# Patient Record
Sex: Female | Born: 1965 | Race: White | Hispanic: No | Marital: Married | Smoking: Never smoker
Health system: Southern US, Community
[De-identification: ages and names within clinical notes are randomized; demographics above are authoritative.]

## PROBLEM LIST (undated history)

## (undated) DIAGNOSIS — N809 Endometriosis, unspecified: Secondary | ICD-10-CM

## (undated) DIAGNOSIS — G510 Bell's palsy: Secondary | ICD-10-CM

---

## 2011-05-03 ENCOUNTER — Ambulatory Visit: Payer: Self-pay | Admitting: Family Medicine

## 2011-05-03 LAB — CBC WITH DIFFERENTIAL/PLATELET
Basophil %: 1.2 %
Eosinophil #: 0 10*3/uL (ref 0.0–0.7)
Eosinophil %: 0.2 %
HGB: 13.6 g/dL (ref 12.0–16.0)
Lymphocyte #: 0.7 10*3/uL — ABNORMAL LOW (ref 1.0–3.6)
MCH: 32.7 pg (ref 26.0–34.0)
Monocyte #: 0.3 x10 3/mm (ref 0.2–0.9)
Monocyte %: 8.3 %
Neutrophil #: 2.8 10*3/uL (ref 1.4–6.5)
Neutrophil %: 72.8 %
Platelet: 159 10*3/uL (ref 150–440)
RDW: 13.5 % (ref 11.5–14.5)
WBC: 3.9 10*3/uL (ref 3.6–11.0)

## 2011-05-03 LAB — COMPREHENSIVE METABOLIC PANEL
Albumin: 3.9 g/dL (ref 3.4–5.0)
BUN: 8 mg/dL (ref 7–18)
Bilirubin,Total: 0.5 mg/dL (ref 0.2–1.0)
Co2: 27 mmol/L (ref 21–32)
Creatinine: 0.88 mg/dL (ref 0.60–1.30)
EGFR (Non-African Amer.): >60
Glucose: 101 mg/dL — ABNORMAL HIGH (ref 65–99)
Osmolality: 269 (ref 275–301)
SGPT (ALT): 22 U/L
Sodium: 135 mmol/L — ABNORMAL LOW (ref 136–145)

## 2014-08-15 ENCOUNTER — Ambulatory Visit
Admission: EM | Admit: 2014-08-15 | Discharge: 2014-08-15 | Disposition: A | Discharge status: Home / Self Care | Payer: BLUE CROSS/BLUE SHIELD | Attending: Internal Medicine | Admitting: Internal Medicine

## 2014-08-15 ENCOUNTER — Encounter: Payer: Self-pay | Admitting: Gynecology

## 2014-08-15 DIAGNOSIS — M436 Torticollis: Secondary | ICD-10-CM

## 2014-08-15 HISTORY — DX: Endometriosis, unspecified: N80.9

## 2014-08-15 HISTORY — DX: Bell's palsy: G51.0

## 2014-08-15 MED ORDER — KETOROLAC TROMETHAMINE 60 MG/2ML IM SOLN
60.0000 mg | Freq: Once | INTRAMUSCULAR | Status: AC
  Administered 2014-08-15: 60 mg via INTRAMUSCULAR

## 2014-08-15 MED ORDER — METHOCARBAMOL 750 MG PO TABS: 750.0000 mg | ORAL_TABLET | Freq: Two times a day (BID) | ORAL | Status: DC

## 2014-08-15 MED ORDER — IBUPROFEN 600 MG PO TABS: 600.0000 mg | ORAL_TABLET | Freq: Four times a day (QID) | ORAL | Status: DC | PRN

## 2014-08-15 NOTE — ED Notes (Signed)
Bed: MUC02 Expected date:  Expected time:  Means of arrival:  Comments: 

## 2014-08-15 NOTE — Discharge Instructions (Signed)

## 2014-08-15 NOTE — ED Notes (Signed)
Pain c/o left earache and left hand pain on and off x 5 days. Pt. Question pinched nerve.

## 2014-08-15 NOTE — ED Provider Notes (Signed)
CSN: [id number]     Arrival date & time 08/15/14  0801 History   First MD Initiated Contact with Patient 08/15/14 [phone]     Chief Complaint  Patient presents with  . Otalgia  . Hand Pain   (Consider location/radiation/quality/duration/timing/severity/associated sxs/prior Treatment) HPI 49 yo F with 3-4 day hx of intermittent left neck,shoulder pain and left ear discomfort.  Early in the week it was  controlled by Aleve- no longer effective. Has a sleep number bed and has tried adjusting without improvement. Stress is way up in office right now. Works at [employer] all day  Long. 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  Has hx of Bells Palsy on 1992-93. Has not been ill.No trauma.  Past Medical History  Diagnosis Date  . Bell palsy   . Endometriosis    History reviewed. No pertinent past surgical history. History reviewed. No pertinent family history. History  Substance Use Topics  . Smoking status: Never Smoker   . Smokeless tobacco: Not on file  . Alcohol Use: Yes   OB History    No data available     Review of Systems Constitutional: No fever.  Eyes: No visual changes. ENT:No sore throat. Left ear recently began hurting- has not been ill-actually left neck muscles Cardiovascular:Negative for chest pain/palpitations Respiratory: Negative for shortness of breath Gastrointestinal: No abdominal pain. No nausea,vomiting, diarrhea Genitourinary: Negative for dysuria. Normal urination. Musculoskeletal: Negative for back pain.- left neck and shoulder have been getting worse; FROM extremities and neck with discomfort on left Skin: Negative for rash Neurological: Negative for headache, focal weakness or numbness  Allergies  Review of patient's allergies indicates no known allergies.  Home Medications   Prior to Admission medications   Medication Sig Start Date End Date Taking? Authorizing Provider  ibuprofen (ADVIL,MOTRIN) 600 MG tablet Take 1  tablet (600 mg total) by mouth every 6 (six) hours as needed for cramping. 08/15/14   Rae Halsted, PA-C  methocarbamol (ROBAXIN) 750 MG tablet Take 1 tablet (750 mg total) by mouth 2 (two) times daily. Take one tablet at bedtime, repeat in AM if needed 08/15/14   Rae Halsted, PA-C   BP 123/80 mmHg  Pulse 61  Temp(Src) 98.1 F (36.7 C) (Oral)  Resp 18  Ht  (1.727 m)  Wt 207 lb (93.895 kg)  BMI 31.48 kg/m2  SpO2 99%  LMP 07/21/2014 Physical Exam   Constitutional -alert and oriented,moderate distress, frightened about left neck pain/shoulder Head-atraumatic, normocephalic Eyes- conjunctiva normal, EOMI ,conjugate gaze Ears- Canals and TM neg Bilat ,left neck muscles tender Nose- no congestion or rhinorrhea Mouth/throat- mucous membranes moist ,oropharynx non-erythematous Neck- supple without glandular enlargement CV- regular rate, grossly normal heart sounds,  Resp-no distress, normal respiratory effort,clear to auscultation bilaterally GI- soft,non-tender,no distention GU-  not examined MSK- See HPI- muscle tension trapezius predominantly ,FROM neck and shoulderwith coaching, anxious. DTRs equal,no paresthesia,good grip bilat  All other extremities are non tender, normal ROM, all extremities, ambulatory, self-care Neuro- normal speech and language, no gross focal neurological deficit appreciated,  Skin-warm,dry ,intact; no rash noted Psych-mood and affect grossly normal; speech and behavior grossly normal ED Course  Procedures (including critical care time) Labs Review Labs Reviewed - No data to display  Imaging Review No results found. Medications  ketorolac (TORADOL) injection 60 mg (60 mg Intramuscular Given 08/15/14 0852)  Wonderful response with decreased discomfort and patient relaxation...she is pleased  MDM   1. Torticollis, acute  Diagnosis and treatment discussed-medications, positional changes, stretch exercises, computer breaks .  Questions fielded,  expectations and recommendations reviewed.  Patient expresses understanding. Will return to [facility] with questions, concern or exacerbation.   Discharge Medication List as of 08/15/2014  9:57 AM    START taking these medications   Details  methocarbamol (ROBAXIN) 750 MG tablet Take 1 tablet (750 mg total) by mouth 2 (two) times daily. Take one tablet at bedtime, repeat in AM if needed, Starting 08/15/2014, Until Discontinued, Normal    ibuprofen (ADVIL,MOTRIN) 600 MG tablet Take 1 tablet (600 mg total) by mouth every 6 (six) hours as needed for cramping., Starting 08/15/2014, Until Discontinued, Print         Rae Halsted, PA-C 08/17/14 [phone]

## 2015-01-06 DIAGNOSIS — N809 Endometriosis, unspecified: Secondary | ICD-10-CM | POA: Insufficient documentation

## 2016-10-22 DIAGNOSIS — M25462 Effusion, left knee: Secondary | ICD-10-CM | POA: Insufficient documentation

## 2016-11-16 DIAGNOSIS — S83232A Complex tear of medial meniscus, current injury, left knee, initial encounter: Secondary | ICD-10-CM | POA: Insufficient documentation

## 2017-01-08 HISTORY — PX: KNEE SURGERY: SHX244

## 2019-09-03 DIAGNOSIS — Z20828 Contact with and (suspected) exposure to other viral communicable diseases: Secondary | ICD-10-CM | POA: Diagnosis not present

## 2019-09-10 ENCOUNTER — Ambulatory Visit: Payer: BLUE CROSS/BLUE SHIELD | Admitting: Podiatry

## 2019-09-10 ENCOUNTER — Other Ambulatory Visit: Payer: Self-pay

## 2019-09-10 ENCOUNTER — Ambulatory Visit (INDEPENDENT_AMBULATORY_CARE_PROVIDER_SITE_OTHER): Payer: BC Managed Care – PPO

## 2019-09-10 DIAGNOSIS — M722 Plantar fascial fibromatosis: Secondary | ICD-10-CM | POA: Diagnosis not present

## 2019-09-10 DIAGNOSIS — N8000 Endometriosis of the uterus, unspecified: Secondary | ICD-10-CM | POA: Insufficient documentation

## 2019-09-10 DIAGNOSIS — N8003 Adenomyosis of the uterus: Secondary | ICD-10-CM | POA: Insufficient documentation

## 2019-09-10 DIAGNOSIS — M79671 Pain in right foot: Secondary | ICD-10-CM

## 2019-09-15 ENCOUNTER — Encounter: Payer: Self-pay | Admitting: Podiatry

## 2019-09-15 NOTE — Progress Notes (Signed)
  Subjective:  Patient ID: Kaitlyn Curtis, female    DOB: 02/27/1965,  MRN: [id number]  Chief Complaint  Patient presents with  . Foot Pain    Patient presents today for right heel/ankle pain and swelling x 1-2 weeks    54 y.o. female presents with the above complaint.  Patient presents with right heel pain that has been going on for quite some time.  Patient states that worse in the morning and has been swollen for past 1 to 2 weeks.  The heel is very painful sharp shooting nature.  Patient has tried elevation naproxen and icing which has not helped.  She would like to discuss treatment options she has not seen anyone else prior to seeing me.   Review of Systems: Negative except as noted in the HPI. Denies N/V/F/Ch.  Past Medical History:  Diagnosis Date  . Bell palsy   . Endometriosis     Current Outpatient Medications:  .  naproxen (NAPROSYN) 250 MG tablet, Take by mouth., Disp: , Rfl:   Social History   Tobacco Use  Smoking Status Never Smoker    No Known Allergies Objective:  There were no vitals filed for this visit. There is no height or weight on file to calculate BMI. Constitutional Well developed. Well nourished.  Vascular Dorsalis pedis pulses palpable bilaterally. Posterior tibial pulses palpable bilaterally. Capillary refill normal to all digits.  No cyanosis or clubbing noted. Pedal hair growth normal.  Neurologic Normal speech. Oriented to person, place, and time. Epicritic sensation to light touch grossly present bilaterally.  Dermatologic Nails well groomed and normal in appearance. No open wounds. No skin lesions.  Orthopedic: Normal joint ROM without pain or crepitus bilaterally. No visible deformities. Tender to palpation at the calcaneal tuber right. No pain with calcaneal squeeze right. Ankle ROM diminished range of motion right. Silfverskiold Test: positive right.   Radiographs: Taken and reviewed. No acute fractures or dislocations. No  evidence of stress fracture.  Plantar heel spur present. Posterior heel spur present.   Assessment:   1. Plantar fasciitis, right   2. Intractable right heel pain    Plan:  Patient was evaluated and treated and all questions answered.  Plantar Fasciitis, right - XR reviewed as above.  - Educated on icing and stretching. Instructions given.  - Injection delivered to the plantar fascia as below. - DME: Plantar Fascial Brace - Pharmacologic management: Meloxicam/Medrol Dose Pak. Educated on risks/benefits and proper taking of medication.  Procedure: Injection Tendon/Ligament Location: Right plantar fascia at the glabrous junction; medial approach. Skin Prep: alcohol Injectate: 0.5 cc 0.5% marcaine plain, 0.5 cc of 1% Lidocaine, 0.5 cc kenalog 10. Disposition: Patient tolerated procedure well. Injection site dressed with a band-aid.  No follow-ups on file.

## 2019-09-29 ENCOUNTER — Other Ambulatory Visit: Payer: BLUE CROSS/BLUE SHIELD

## 2019-09-29 DIAGNOSIS — J069 Acute upper respiratory infection, unspecified: Secondary | ICD-10-CM | POA: Diagnosis not present

## 2019-09-30 DIAGNOSIS — J069 Acute upper respiratory infection, unspecified: Secondary | ICD-10-CM | POA: Diagnosis not present

## 2019-09-30 DIAGNOSIS — R030 Elevated blood-pressure reading, without diagnosis of hypertension: Secondary | ICD-10-CM | POA: Diagnosis not present

## 2019-10-08 ENCOUNTER — Ambulatory Visit: Payer: BC Managed Care – PPO | Admitting: Podiatry

## 2019-11-03 ENCOUNTER — Observation Stay: Payer: BC Managed Care – PPO | Admitting: Anesthesiology

## 2019-11-03 ENCOUNTER — Encounter
Admission: EM | Disposition: A | Discharge status: Home / Self Care | Payer: Self-pay | Source: Home / Self Care | Attending: Student in an Organized Health Care Education/Training Program

## 2019-11-03 ENCOUNTER — Ambulatory Visit (INDEPENDENT_AMBULATORY_CARE_PROVIDER_SITE_OTHER)
Admit: 2019-11-03 | Discharge: 2019-11-03 | Disposition: A | Discharge status: Home / Self Care | Payer: BC Managed Care – PPO | Attending: Family Medicine | Admitting: Family Medicine

## 2019-11-03 ENCOUNTER — Other Ambulatory Visit: Payer: Self-pay

## 2019-11-03 ENCOUNTER — Encounter: Payer: Self-pay | Admitting: Emergency Medicine

## 2019-11-03 ENCOUNTER — Ambulatory Visit (INDEPENDENT_AMBULATORY_CARE_PROVIDER_SITE_OTHER)
Admission: EM | Admit: 2019-11-03 | Discharge: 2019-11-03 | Disposition: A | Discharge status: Other Acute Inpatient Hospital | Payer: BC Managed Care – PPO | Source: Home / Self Care | Attending: Family Medicine | Admitting: Family Medicine

## 2019-11-03 ENCOUNTER — Observation Stay
Admission: EM | Admit: 2019-11-03 | Discharge: 2019-11-04 | Disposition: A | Discharge status: Home / Self Care | Payer: BC Managed Care – PPO | Attending: Surgery | Admitting: Surgery

## 2019-11-03 DIAGNOSIS — K3532 Acute appendicitis with perforation and localized peritonitis, without abscess: Secondary | ICD-10-CM | POA: Diagnosis not present

## 2019-11-03 DIAGNOSIS — R1031 Right lower quadrant pain: Secondary | ICD-10-CM

## 2019-11-03 DIAGNOSIS — K3580 Unspecified acute appendicitis: Principal | ICD-10-CM | POA: Insufficient documentation

## 2019-11-03 DIAGNOSIS — Z20822 Contact with and (suspected) exposure to covid-19: Secondary | ICD-10-CM | POA: Insufficient documentation

## 2019-11-03 DIAGNOSIS — R5381 Other malaise: Secondary | ICD-10-CM | POA: Diagnosis not present

## 2019-11-03 HISTORY — PX: LAPAROSCOPIC APPENDECTOMY: SHX408

## 2019-11-03 LAB — URINALYSIS, COMPLETE (UACMP) WITH MICROSCOPIC
Bilirubin Urine: NEGATIVE
Glucose, UA: NEGATIVE mg/dL
Ketones, ur: NEGATIVE mg/dL
Leukocytes,Ua: NEGATIVE
Nitrite: NEGATIVE
Protein, ur: 30 mg/dL — AB
Specific Gravity, Urine: 1.025 (ref 1.005–1.030)
pH: 6 (ref 5.0–8.0)

## 2019-11-03 LAB — CBC WITH DIFFERENTIAL/PLATELET
Abs Immature Granulocytes: 0.04 10*3/uL (ref 0.00–0.07)
Basophils Absolute: 0.1 10*3/uL (ref 0.0–0.1)
Basophils Relative: 0 %
Eosinophils Absolute: 0 10*3/uL (ref 0.0–0.5)
Eosinophils Relative: 0 %
HCT: 39 % (ref 36.0–46.0)
Hemoglobin: 13 g/dL (ref 12.0–15.0)
Immature Granulocytes: 0 %
Lymphocytes Relative: 15 %
Lymphs Abs: 1.8 10*3/uL (ref 0.7–4.0)
MCH: 31.6 pg (ref 26.0–34.0)
MCHC: 33.3 g/dL (ref 30.0–36.0)
MCV: 94.9 fL (ref 80.0–100.0)
Monocytes Absolute: 0.8 10*3/uL (ref 0.1–1.0)
Monocytes Relative: 7 %
Neutro Abs: 9.3 10*3/uL — ABNORMAL HIGH (ref 1.7–7.7)
Neutrophils Relative %: 78 %
Platelets: 220 10*3/uL (ref 150–400)
RBC: 4.11 MIL/uL (ref 3.87–5.11)
RDW: 14 % (ref 11.5–15.5)
WBC: 11.9 10*3/uL — ABNORMAL HIGH (ref 4.0–10.5)
nRBC: 0 % (ref 0.0–0.2)

## 2019-11-03 LAB — COMPREHENSIVE METABOLIC PANEL
ALT: 31 U/L (ref 0–44)
AST: 22 U/L (ref 15–41)
Albumin: 4 g/dL (ref 3.5–5.0)
Alkaline Phosphatase: 85 U/L (ref 38–126)
Anion gap: 11 (ref 5–15)
BUN: 10 mg/dL (ref 6–20)
CO2: 28 mmol/L (ref 22–32)
Calcium: 8.9 mg/dL (ref 8.9–10.3)
Chloride: 97 mmol/L — ABNORMAL LOW (ref 98–111)
Creatinine, Ser: 0.78 mg/dL (ref 0.44–1.00)
GFR, Estimated: >60 mL/min (ref >60)
Glucose, Bld: 124 mg/dL — ABNORMAL HIGH (ref 70–99)
Potassium: 3.7 mmol/L (ref 3.5–5.1)
Sodium: 136 mmol/L (ref 135–145)
Total Bilirubin: 1.4 mg/dL — ABNORMAL HIGH (ref 0.3–1.2)
Total Protein: 7.8 g/dL (ref 6.5–8.1)

## 2019-11-03 LAB — RESPIRATORY PANEL BY RT PCR (FLU A&B, COVID)
Influenza A by PCR: NEGATIVE
Influenza B by PCR: NEGATIVE
SARS Coronavirus 2 by RT PCR: NEGATIVE

## 2019-11-03 SURGERY — APPENDECTOMY, LAPAROSCOPIC
Anesthesia: General | Site: Abdomen

## 2019-11-03 MED ORDER — BUPIVACAINE-EPINEPHRINE (PF) 0.5% -1:200000 IJ SOLN
INTRAMUSCULAR | Status: DC | PRN
  Administered 2019-11-03: 31 mL

## 2019-11-03 MED ORDER — FENTANYL CITRATE (PF) 100 MCG/2ML IJ SOLN
INTRAMUSCULAR | Status: DC | PRN
  Administered 2019-11-03 (×2): 50 ug via INTRAVENOUS

## 2019-11-03 MED ORDER — KETOROLAC TROMETHAMINE 30 MG/ML IJ SOLN
30.0000 mg | Freq: Four times a day (QID) | INTRAMUSCULAR | Status: DC
  Administered 2019-11-03 – 2019-11-04 (×3): 30 mg via INTRAVENOUS
  Filled 2019-11-03 (×3): qty 1

## 2019-11-03 MED ORDER — SODIUM CHLORIDE 0.9 % IV BOLUS
1000.0000 mL | Freq: Once | INTRAVENOUS | Status: AC
  Administered 2019-11-03: 1000 mL via INTRAVENOUS

## 2019-11-03 MED ORDER — DEXMEDETOMIDINE (PRECEDEX) IN NS 20 MCG/5ML (4 MCG/ML) IV SYRINGE
PREFILLED_SYRINGE | INTRAVENOUS | Status: AC
  Filled 2019-11-03: qty 5

## 2019-11-03 MED ORDER — KETOROLAC TROMETHAMINE 30 MG/ML IJ SOLN
INTRAMUSCULAR | Status: DC | PRN
  Administered 2019-11-03: 30 mg via INTRAVENOUS

## 2019-11-03 MED ORDER — LACTATED RINGERS IV SOLN
125.0000 mL/h | INTRAVENOUS | Status: DC
  Administered 2019-11-03 – 2019-11-04 (×2): 125 mL/h via INTRAVENOUS

## 2019-11-03 MED ORDER — ONDANSETRON HCL 4 MG/2ML IJ SOLN: 4.0000 mg | Freq: Once | INTRAMUSCULAR | Status: DC | PRN

## 2019-11-03 MED ORDER — PROPOFOL 10 MG/ML IV BOLUS
INTRAVENOUS | Status: AC
  Filled 2019-11-03: qty 20

## 2019-11-03 MED ORDER — OXYCODONE HCL 5 MG PO TABS: 5.0000 mg | ORAL_TABLET | ORAL | Status: DC | PRN

## 2019-11-03 MED ORDER — HYDROMORPHONE HCL 1 MG/ML IJ SOLN
INTRAMUSCULAR | Status: AC
  Filled 2019-11-03: qty 1

## 2019-11-03 MED ORDER — IOHEXOL 300 MG/ML  SOLN
100.0000 mL | Freq: Once | INTRAMUSCULAR | Status: AC | PRN
  Administered 2019-11-03: 100 mL via INTRAVENOUS

## 2019-11-03 MED ORDER — PROPOFOL 10 MG/ML IV BOLUS
INTRAVENOUS | Status: DC | PRN
  Administered 2019-11-03: 150 mg via INTRAVENOUS

## 2019-11-03 MED ORDER — PANTOPRAZOLE SODIUM 40 MG IV SOLR
40.0000 mg | Freq: Every day | INTRAVENOUS | Status: DC
  Administered 2019-11-03: 40 mg via INTRAVENOUS
  Filled 2019-11-03: qty 40

## 2019-11-03 MED ORDER — SUGAMMADEX SODIUM 200 MG/2ML IV SOLN
INTRAVENOUS | Status: DC | PRN
  Administered 2019-11-03: 200 mg via INTRAVENOUS

## 2019-11-03 MED ORDER — SODIUM CHLORIDE 0.9 % IV SOLN: INTRAVENOUS | Status: DC | PRN

## 2019-11-03 MED ORDER — MIDAZOLAM HCL 2 MG/2ML IJ SOLN
INTRAMUSCULAR | Status: DC | PRN
  Administered 2019-11-03: 2 mg via INTRAVENOUS

## 2019-11-03 MED ORDER — LIDOCAINE HCL (CARDIAC) PF 100 MG/5ML IV SOSY
PREFILLED_SYRINGE | INTRAVENOUS | Status: DC | PRN
  Administered 2019-11-03: 80 mg via INTRAVENOUS

## 2019-11-03 MED ORDER — HYDROMORPHONE HCL 1 MG/ML IJ SOLN: 0.5000 mg | INTRAMUSCULAR | Status: DC | PRN

## 2019-11-03 MED ORDER — ONDANSETRON HCL 4 MG/2ML IJ SOLN
INTRAMUSCULAR | Status: DC | PRN
  Administered 2019-11-03: 4 mg via INTRAVENOUS

## 2019-11-03 MED ORDER — DEXAMETHASONE SODIUM PHOSPHATE 10 MG/ML IJ SOLN
INTRAMUSCULAR | Status: DC | PRN
  Administered 2019-11-03: 10 mg via INTRAVENOUS

## 2019-11-03 MED ORDER — HYDROMORPHONE HCL 1 MG/ML IJ SOLN
INTRAMUSCULAR | Status: DC | PRN
Start: 2019-11-03 — End: 2019-11-03
  Administered 2019-11-03: .5 mg via INTRAVENOUS

## 2019-11-03 MED ORDER — MIDAZOLAM HCL 2 MG/2ML IJ SOLN
INTRAMUSCULAR | Status: AC
  Filled 2019-11-03: qty 2

## 2019-11-03 MED ORDER — ONDANSETRON HCL 4 MG/2ML IJ SOLN: 4.0000 mg | Freq: Four times a day (QID) | INTRAMUSCULAR | Status: DC | PRN

## 2019-11-03 MED ORDER — KETOROLAC TROMETHAMINE 30 MG/ML IJ SOLN
INTRAMUSCULAR | Status: AC
  Filled 2019-11-03: qty 1

## 2019-11-03 MED ORDER — FENTANYL CITRATE (PF) 100 MCG/2ML IJ SOLN: 25.0000 ug | INTRAMUSCULAR | Status: DC | PRN

## 2019-11-03 MED ORDER — DEXMEDETOMIDINE HCL 200 MCG/2ML IV SOLN
INTRAVENOUS | Status: DC | PRN
  Administered 2019-11-03: 8 ug via INTRAVENOUS
  Administered 2019-11-03: 12 ug via INTRAVENOUS

## 2019-11-03 MED ORDER — SUCCINYLCHOLINE CHLORIDE 20 MG/ML IJ SOLN
INTRAMUSCULAR | Status: DC | PRN
  Administered 2019-11-03: 100 mg via INTRAVENOUS

## 2019-11-03 MED ORDER — PIPERACILLIN-TAZOBACTAM 3.375 G IVPB
3.3750 g | Freq: Three times a day (TID) | INTRAVENOUS | Status: DC
  Administered 2019-11-03 – 2019-11-04 (×2): 3.375 g via INTRAVENOUS
  Filled 2019-11-03 (×2): qty 50

## 2019-11-03 MED ORDER — ONDANSETRON 4 MG PO TBDP: 4.0000 mg | ORAL_TABLET | Freq: Four times a day (QID) | ORAL | Status: DC | PRN

## 2019-11-03 MED ORDER — ONDANSETRON HCL 4 MG/2ML IJ SOLN: 4.0000 mg | Freq: Once | INTRAMUSCULAR | Status: DC

## 2019-11-03 MED ORDER — POLYETHYLENE GLYCOL 3350 17 G PO PACK: 17.0000 g | PACK | Freq: Every day | ORAL | Status: DC | PRN

## 2019-11-03 MED ORDER — ACETAMINOPHEN 500 MG PO TABS: 1000.0000 mg | ORAL_TABLET | Freq: Four times a day (QID) | ORAL | Status: DC | PRN

## 2019-11-03 MED ORDER — ROCURONIUM BROMIDE 100 MG/10ML IV SOLN
INTRAVENOUS | Status: DC | PRN
  Administered 2019-11-03: 10 mg via INTRAVENOUS
  Administered 2019-11-03: 40 mg via INTRAVENOUS

## 2019-11-03 MED ORDER — ENOXAPARIN SODIUM 40 MG/0.4ML ~~LOC~~ SOLN
40.0000 mg | SUBCUTANEOUS | Status: DC
  Administered 2019-11-04: 40 mg via SUBCUTANEOUS
  Filled 2019-11-03: qty 0.4

## 2019-11-03 MED ORDER — MORPHINE SULFATE (PF) 4 MG/ML IV SOLN: 4.0000 mg | INTRAVENOUS | Status: DC | PRN

## 2019-11-03 MED ORDER — PIPERACILLIN-TAZOBACTAM 3.375 G IVPB 30 MIN
3.3750 g | Freq: Once | INTRAVENOUS | Status: AC
  Administered 2019-11-03: 3.375 g via INTRAVENOUS
  Filled 2019-11-03: qty 50

## 2019-11-03 MED ORDER — SODIUM CHLORIDE 0.9 % IR SOLN
Status: DC | PRN
  Administered 2019-11-03: 700 mL

## 2019-11-03 MED ORDER — FENTANYL CITRATE (PF) 100 MCG/2ML IJ SOLN
INTRAMUSCULAR | Status: AC
  Filled 2019-11-03: qty 2

## 2019-11-03 SURGICAL SUPPLY — 45 items
CANISTER SUCT 1200ML W/VALVE (MISCELLANEOUS) IMPLANT
CHLORAPREP W/TINT 26 (MISCELLANEOUS) ×3 IMPLANT
COVER WAND RF STERILE (DRAPES) ×3 IMPLANT
CUTTER FLEX LINEAR 45M (STAPLE) IMPLANT
DEFOGGER SCOPE WARMER CLEARIFY (MISCELLANEOUS) ×3 IMPLANT
DERMABOND ADVANCED (GAUZE/BANDAGES/DRESSINGS) ×2
DERMABOND ADVANCED .7 DNX12 (GAUZE/BANDAGES/DRESSINGS) ×1 IMPLANT
ELECT CAUTERY BLADE 6.4 (BLADE) ×3 IMPLANT
ELECT REM PT RETURN 9FT ADLT (ELECTROSURGICAL) ×3
ELECTRODE REM PT RTRN 9FT ADLT (ELECTROSURGICAL) ×1 IMPLANT
GLOVE BIO SURGEON STRL SZ 6 (GLOVE) ×6 IMPLANT
GLOVE BIOGEL PI IND STRL 6.5 (GLOVE) ×2 IMPLANT
GLOVE BIOGEL PI INDICATOR 6.5 (GLOVE) ×4
GLOVE SURG SYN 7.0 (GLOVE) ×3 IMPLANT
GLOVE SURG SYN 7.5  E (GLOVE) ×2
GLOVE SURG SYN 7.5 E (GLOVE) ×1 IMPLANT
GOWN STRL REUS W/ TWL LRG LVL3 (GOWN DISPOSABLE) ×3 IMPLANT
GOWN STRL REUS W/TWL LRG LVL3 (GOWN DISPOSABLE) ×6
IRRIGATION STRYKERFLOW (MISCELLANEOUS) IMPLANT
IRRIGATOR STRYKERFLOW (MISCELLANEOUS)
IV NS 1000ML (IV SOLUTION)
IV NS 1000ML BAXH (IV SOLUTION) IMPLANT
KIT TURNOVER KIT A (KITS) ×3 IMPLANT
LABEL OR SOLS (LABEL) ×3 IMPLANT
LIGASURE LAP MARYLAND 5MM 37CM (ELECTROSURGICAL) IMPLANT
NEEDLE HYPO 22GX1.5 SAFETY (NEEDLE) ×3 IMPLANT
NS IRRIG 500ML POUR BTL (IV SOLUTION) ×3 IMPLANT
PACK LAP CHOLECYSTECTOMY (MISCELLANEOUS) ×3 IMPLANT
PENCIL ELECTRO HAND CTR (MISCELLANEOUS) ×3 IMPLANT
POUCH SPECIMEN RETRIEVAL 10MM (ENDOMECHANICALS) ×3 IMPLANT
RELOAD 45 VASCULAR/THIN (ENDOMECHANICALS) IMPLANT
RELOAD STAPLE TA45 3.5 REG BLU (ENDOMECHANICALS) ×3 IMPLANT
SCISSORS METZENBAUM CVD 33 (INSTRUMENTS) ×3 IMPLANT
SLEEVE ADV FIXATION 5X100MM (TROCAR) ×3 IMPLANT
SUT MNCRL 4-0 (SUTURE) ×2
SUT MNCRL 4-0 27XMFL (SUTURE) ×1
SUT VIC AB 3-0 SH 27 (SUTURE) ×2
SUT VIC AB 3-0 SH 27X BRD (SUTURE) ×1 IMPLANT
SUT VICRYL 0 AB UR-6 (SUTURE) ×3 IMPLANT
SUTURE MNCRL 4-0 27XMF (SUTURE) ×1 IMPLANT
SYS KII FIOS ACCESS ABD 5X100 (TROCAR) ×3
SYSTEM KII FIOS ACES ABD 5X100 (TROCAR) ×1 IMPLANT
TRAY FOLEY MTR SLVR 16FR STAT (SET/KITS/TRAYS/PACK) ×3 IMPLANT
TROCAR BALLN GELPORT 12X130M (ENDOMECHANICALS) ×3 IMPLANT
TUBING EVAC SMOKE HEATED PNEUM (TUBING) ×3 IMPLANT

## 2019-11-03 NOTE — ED Triage Notes (Signed)
BIB [facility] from [facility] due to diagnosed appendicitis. Pt reports pain 8/10. Arrives with PIV in place from UC. Pt alert and oriented X4, cooperative, RR even and unlabored, color WNL. Pt in NAD.

## 2019-11-03 NOTE — Transfer of Care (Signed)
Immediate Anesthesia Transfer of Care Note  Patient: Kaitlyn Curtis  Procedure(s) Performed: APPENDECTOMY LAPAROSCOPIC (N/A Abdomen)  Patient Location: PACU  Anesthesia Type:General  Level of Consciousness: awake, alert, and oriented  Airway & Oxygen Therapy: Patient Spontanous Breathing  Post-op Assessment: Report given to RN and Post -op Vital signs reviewed and stable  Post vital signs: Reviewed and stable  Last Vitals:  Vitals Value Taken Time  BP 126/78 11/03/19 2024  Temp 37.1 C 11/03/19 2024  Pulse 85 11/03/19 2024  Resp 15 11/03/19 2024  SpO2 95 % 11/03/19 2024    Last Pain:  Vitals:   11/03/19 2024  TempSrc:   PainSc: 0-No pain         Complications: No complications documented.

## 2019-11-03 NOTE — Op Note (Signed)
  Procedure Date:  11/03/2019  Pre-operative Diagnosis:  Acute appendicitis  Post-operative Diagnosis:  Acute appendicitis  Procedure:  Laparoscopic appendectomy  Surgeon:  Howie Ill, MD  Anesthesia:  General endotracheal  Estimated Blood Loss:  20 ml  Specimens:  appendix  Complications:  None  Indications for Procedure:  This is a 54 y.o. female who presents with abdominal pain and workup revealing acute appendicitis.  The options of surgery versus observation were reviewed with the patient and/or family. The risks of bleeding, infection, recurrence of symptoms, negative laparoscopy, potential for an open procedure, bowel injury, abscess or infection, were all discussed with the patient and she was willing to proceed.  Description of Procedure: The patient was correctly identified in the preoperative area and brought into the operating room.  The patient was placed supine with VTE prophylaxis in place.  Appropriate time-outs were performed.  Anesthesia was induced and the patient was intubated.  Foley catheter was placed.  Appropriate antibiotics were infused.  The abdomen was prepped and draped in a sterile fashion. An infraumbilical incision was made. A cutdown technique was used to enter the abdominal cavity without injury, and a Hasson trocar was inserted.  Pneumoperitoneum was obtained with appropriate opening pressures.  Two 5-mm ports were placed in the suprapubic and left lateral positions under direct visualization.  The right lower quadrant was inspected and the appendix was identified and found to be acutely inflamed with ascitic fluid in the pelvis, but no purulence and no evidence of perforation.  The appendix was carefully dissected.  The mesoappendix was divided using the LigaSure.  The base of the appendix was dissected out and divided with a standard load Endo GIA.  The appendix was placed in an Endocatch bag.  The right lower quadrant was then inspected again  revealing an intact staple line, no bleeding, and no bowel injury.  The area was thoroughly irrigated.  The 5 mm ports were removed under direct visualization and the Hasson trocar was removed.  The Endocatch bag was brought out through the umbilical incision.  The fascial opening was closed using 0 vicryl suture.  Local anesthetic was infused in all incisions.  The umbilical incision was closed with 3-0 Vicryl and 4-0 Monocryl, and the remaining incisions were closed with 4-0 Monocryl.  The wounds were cleaned and sealed with DermaBond.  Foley catheter was removed and the patient was emerged from anesthesia and extubated and brought to the recovery room for further management.  The patient tolerated the procedure well and all counts were correct at the end of the case.   Howie Ill, MD

## 2019-11-03 NOTE — Anesthesia Preprocedure Evaluation (Signed)
Anesthesia Evaluation  Patient identified by MRN, date of birth, ID band Patient awake    Reviewed: Allergy & Precautions, H&P , NPO status , Patient's Chart, lab work & pertinent test results, reviewed documented beta blocker date and time   Airway Mallampati: II  TM Distance: >3 FB Neck ROM: full    Dental  (+) Teeth Intact   Pulmonary neg pulmonary ROS,    Pulmonary exam normal        Cardiovascular Exercise Tolerance: Good negative cardio ROS Normal cardiovascular exam Rhythm:regular Rate:Normal     Neuro/Psych  Neuromuscular disease negative psych ROS   GI/Hepatic negative GI ROS, Neg liver ROS,   Endo/Other  negative endocrine ROS  Renal/GU negative Renal ROS  negative genitourinary   Musculoskeletal   Abdominal   Peds  Hematology negative hematology ROS (+)   Anesthesia Other Findings Past Medical History: No date: Bell palsy No date: Endometriosis History reviewed. No pertinent surgical history. BMI    Body Mass Index: 33.19 kg/m     Reproductive/Obstetrics negative OB ROS                             Anesthesia Physical Anesthesia Plan  ASA: II and emergent  Anesthesia Plan: General ETT   Post-op Pain Management:    Induction:   PONV Risk Score and Plan:   Airway Management Planned:   Additional Equipment:   Intra-op Plan:   Post-operative Plan:   Informed Consent: I have reviewed the patients History and Physical, chart, labs and discussed the procedure including the risks, benefits and alternatives for the proposed anesthesia with the patient or authorized representative who has indicated his/her understanding and acceptance.     Dental Advisory Given  Plan Discussed with: CRNA  Anesthesia Plan Comments:         Anesthesia Quick Evaluation

## 2019-11-03 NOTE — ED Provider Notes (Signed)
[facility] Emergency Department Provider Note    First MD Initiated Contact with Patient 11/03/19 1606     (approximate)  I have reviewed the triage vital signs and the nursing notes.   HISTORY  Chief Complaint Abdominal Pain    HPI Kaitlyn Curtis is a 55 y.o. female with no significant past medical history presents to the ER for 24 hours of what initially was generalized abdominal pain now migrating to right lower quadrant is sharp in nature.  Has had some nausea decreased p.o. intake.  Was seen in urgent care for this complaint and found to have evidence of acute appendicitis by CT.  Sent to the ER for further evaluation.    Past Medical History:  Diagnosis Date  . Bell palsy   . Endometriosis    No family history on file. History reviewed. No pertinent surgical history. Patient Active Problem List   Diagnosis Date Noted  . Uterus, adenomyosis 09/10/2019  . Complex tear of medial meniscus of left knee as current injury 11/16/2016  . Effusion, left knee 10/22/2016  . Endometriosis 01/06/2015      Prior to Admission medications   Not on File    Allergies Patient has no known allergies.    Social History Social History   Tobacco Use  . Smoking status: Never Smoker  . Smokeless tobacco: Never Used  Substance Use Topics  . Alcohol use: Yes  . Drug use: No    Review of Systems Patient denies headaches, rhinorrhea, blurry vision, numbness, shortness of breath, chest pain, edema, cough, abdominal pain, nausea, vomiting, diarrhea, dysuria, fevers, rashes or hallucinations unless otherwise stated above in HPI. ____________________________________________   PHYSICAL EXAM:  VITAL SIGNS: Vitals:   11/03/19 1603  BP: 128/68  Pulse: 81  Resp: 18  Temp: 99.8 F (37.7 C)  SpO2: 96%    Constitutional: Alert and oriented.  Eyes: Conjunctivae are normal.  Head: Atraumatic. Nose: No congestion/rhinnorhea. Mouth/Throat: Mucous  membranes are moist.   Neck: No stridor. Painless ROM.  Cardiovascular: Normal rate, regular rhythm. Grossly normal heart sounds.  Good peripheral circulation. Respiratory: Normal respiratory effort.  No retractions. Lungs CTAB. Gastrointestinal: Soft , ttp in rlq. No distention. No abdominal bruits. No CVA tenderness. Genitourinary:  Musculoskeletal: No lower extremity tenderness nor edema.  No joint effusions. Neurologic:  Normal speech and language. No gross focal neurologic deficits are appreciated. No facial droop Skin:  Skin is warm, dry and intact. No rash noted. Psychiatric: Mood and affect are normal. Speech and behavior are normal.  ____________________________________________   LABS (all labs ordered are listed, but only abnormal results are displayed)  Results for orders placed or performed during the hospital encounter of 11/03/19 (from the past 24 hour(s))  Urinalysis, Complete w Microscopic Urine, Clean Catch     Status: Abnormal   Collection Time: 11/03/19  1:13 PM  Result Value Ref Range   Color, Urine YELLOW YELLOW   APPearance CLEAR CLEAR   Specific Gravity, Urine 1.025 1.005 - 1.030   pH 6.0 5.0 - 8.0   Glucose, UA NEGATIVE NEGATIVE mg/dL   Hgb urine dipstick TRACE (A) NEGATIVE   Bilirubin Urine NEGATIVE NEGATIVE   Ketones, ur NEGATIVE NEGATIVE mg/dL   Protein, ur 30 (A) NEGATIVE mg/dL   Nitrite NEGATIVE NEGATIVE   Leukocytes,Ua NEGATIVE NEGATIVE   Squamous Epithelial / LPF 0-5 0 - 5   WBC, UA 0-5 0 - 5 WBC/hpf   RBC / HPF 6-10 0 - 5 RBC/hpf  Bacteria, UA FEW (A) NONE SEEN   Mucus PRESENT   CBC with Differential     Status: Abnormal   Collection Time: 11/03/19  1:55 PM  Result Value Ref Range   WBC 11.9 (H) 4.0 - 10.5 K/uL   RBC 4.11 3.87 - 5.11 MIL/uL   Hemoglobin 13.0 12.0 - 15.0 g/dL   HCT 16.0 36 - 46 %   MCV 94.9 80.0 - 100.0 fL   MCH 31.6 26.0 - 34.0 pg   MCHC 33.3 30.0 - 36.0 g/dL   RDW 10.9 32.3 - 55.7 %   Platelets 220 150 - 400 K/uL    nRBC 0.0 0.0 - 0.2 %   Neutrophils Relative % 78 %   Neutro Abs 9.3 (H) 1.7 - 7.7 K/uL   Lymphocytes Relative 15 %   Lymphs Abs 1.8 0.7 - 4.0 K/uL   Monocytes Relative 7 %   Monocytes Absolute 0.8 0.1 - 1.0 K/uL   Eosinophils Relative 0 %   Eosinophils Absolute 0.0 0.0 - 0.5 K/uL   Basophils Relative 0 %   Basophils Absolute 0.1 0.0 - 0.1 K/uL   Immature Granulocytes 0 %   Abs Immature Granulocytes 0.04 0.00 - 0.07 K/uL  Comprehensive metabolic panel     Status: Abnormal   Collection Time: 11/03/19  1:55 PM  Result Value Ref Range   Sodium 136 135 - 145 mmol/L   Potassium 3.7 3.5 - 5.1 mmol/L   Chloride 97 (L) 98 - 111 mmol/L   CO2 28 22 - 32 mmol/L   Glucose, Bld 124 (H) 70 - 99 mg/dL   BUN 10 6 - 20 mg/dL   Creatinine, Ser 3.22 0.44 - 1.00 mg/dL   Calcium 8.9 8.9 - 02.5 mg/dL   Total Protein 7.8 6.5 - 8.1 g/dL   Albumin 4.0 3.5 - 5.0 g/dL   AST 22 15 - 41 U/L   ALT 31 0 - 44 U/L   Alkaline Phosphatase 85 38 - 126 U/L   Total Bilirubin 1.4 (H) 0.3 - 1.2 mg/dL   GFR, Estimated >42 >70 mL/min   Anion gap 11 5 - 15   ____________________________________________ ____________________________________________  RADIOLOGY  CT abd reviewed ____________________________________________   PROCEDURES  Procedure(s) performed:  Procedures    Critical Care performed: no ____________________________________________   INITIAL IMPRESSION / ASSESSMENT AND PLAN / ED COURSE  Pertinent labs & imaging results that were available during my care of the patient were reviewed by me and considered in my medical decision making (see chart for details).   DDX: Appendicitis, UTI, diverticulitis  Kaitlyn Curtis is a 54 y.o. who presents to the ED with evidence of acute appendicitis by CT from urgent care.  She is hemodynamically stable.  Will give IV pain medication as well as IV fluids.  Will consult surgery for admission.     The patient was evaluated in Emergency Department today  for the symptoms described in the history of present illness. He/she was evaluated in the context of the global COVID-19 pandemic, which necessitated consideration that the patient might be at risk for infection with the SARS-CoV-2 virus that causes COVID-19. Institutional protocols and algorithms that pertain to the evaluation of patients at risk for COVID-19 are in a state of rapid change based on information released by regulatory bodies including the CDC and federal and state organizations. These policies and algorithms were followed during the patient's care in the ED.  As part of my medical decision making, I reviewed the  following data within the electronic MEDICAL RECORD NUMBER Nursing notes reviewed and incorporated, Labs reviewed, notes from prior ED visits and [place] Controlled Substance Database   ____________________________________________   FINAL CLINICAL IMPRESSION(S) / ED DIAGNOSES  Final diagnoses:  Right lower quadrant abdominal pain  Acute appendicitis, unspecified acute appendicitis type      NEW MEDICATIONS STARTED DURING THIS VISIT:  New Prescriptions   No medications on file     Note:  This document was prepared using Dragon voice recognition software and may include unintentional dictation errors.    Willy Eddy, MD 11/03/19 [phone]

## 2019-11-03 NOTE — H&P (Signed)
Date of Admission:  11/03/2019  Reason for Admission:  Acute appendicitis  History of Present Illness: Kaitlyn Curtis is a 54 y.o. female presenting with a 2-day history of abdominal pain.  Patient reports that yesterday she started having mid abdominal pain that today started progressing and localizing towards the right lower quadrant.  Patient reports that the pain was quite severe earlier.  She denies having any nausea or vomiting but reports feeling feverish at home.  She presented to urgent care today and she had a CT scan done there which showed acute appendicitis and was sent to the emergency room for further evaluation.  Her laboratory work-up shows a white blood cell count of 11.9 with a normal creatinine of 0.78.  Her CT scan which is personally viewed by me shows acute appendicitis with no evidence of perforation or abscess.  The appendix is retrocecal.  Past Medical History: Past Medical History:  Diagnosis Date  . Bell palsy   . Endometriosis      Past Surgical History: --Diagnostic laparoscopy for endometriosis  Home Medications: Prior to Admission medications   None    Allergies: No Known Allergies  Social History:  reports that she has never smoked. She has never used smokeless tobacco. She reports current alcohol use. She reports that she does not use drugs.   Family History: No family history on file.  Review of Systems: Review of Systems  Constitutional: Positive for fever. Negative for chills.  HENT: Negative for hearing loss.   Respiratory: Negative for shortness of breath.   Cardiovascular: Negative for chest pain.  Gastrointestinal: Positive for abdominal pain. Negative for constipation, diarrhea, nausea and vomiting.  Genitourinary: Negative for dysuria.  Musculoskeletal: Negative for myalgias.  Skin: Negative for rash.  Neurological: Negative for dizziness.  Psychiatric/Behavioral: Negative for depression.    Physical Exam BP 128/68 (BP  Location: Left Arm)   Pulse 81   Temp 99.8 F (37.7 C) (Oral)   Resp 18   Ht 5\' 8"  (1.727 m)   Wt 99 kg   LMP 07/21/2014   SpO2 96%   BMI 33.19 kg/m  CONSTITUTIONAL: No acute distress HEENT:  Normocephalic, atraumatic, extraocular motion intact. NECK: Trachea is midline, and there is no jugular venous distension.  RESPIRATORY:  Lungs are clear, and breath sounds are equal bilaterally. Normal respiratory effort without pathologic use of accessory muscles. CARDIOVASCULAR: Heart is regular without murmurs, gallops, or rubs. GI: The abdomen is soft, obese, non-distended, with tenderness to palpation from mid abdomen going to RLQ, with worse pain in the RLQ.  No generalized peritonitis.  MUSCULOSKELETAL:  Normal muscle strength and tone in all four extremities.  No peripheral edema or cyanosis. SKIN: Skin turgor is normal. There are no pathologic skin lesions.  NEUROLOGIC:  Motor and sensation is grossly normal.  Cranial nerves are grossly intact. PSYCH:  Alert and oriented to person, place and time. Affect is normal.  Laboratory Analysis: Results for orders placed or performed during the hospital encounter of 11/03/19 (from the past 24 hour(s))  Respiratory Panel by RT PCR (Flu A&B, Covid) - Nasopharyngeal Swab     Status: None   Collection Time: 11/03/19  4:37 PM   Specimen: Nasopharyngeal Swab  Result Value Ref Range   SARS Coronavirus 2 by RT PCR NEGATIVE NEGATIVE   Influenza A by PCR NEGATIVE NEGATIVE   Influenza B by PCR NEGATIVE NEGATIVE    Imaging: CT ABDOMEN PELVIS W CONTRAST  Result Date: 11/03/2019 CLINICAL DATA:  Right lower  quadrant pain EXAM: CT ABDOMEN AND PELVIS WITH CONTRAST TECHNIQUE: Multidetector CT imaging of the abdomen and pelvis was performed using the standard protocol following bolus administration of intravenous contrast. CONTRAST:  OMNIPAQUE IOHEXOL 300 MG/ML  SOLN COMPARISON:  None. FINDINGS: Lower chest: No acute abnormality. Hepatobiliary: Liver is  unremarkable. Gallbladder is unremarkable. No biliary dilatation. Pancreas: Unremarkable. Spleen: Unremarkable. Adrenals/Urinary Tract: Adrenals, kidneys, and bladder are unremarkable. Stomach/Bowel: Stomach is within normal limits. The appendix is fluid-filled and mildly dilated. There are surrounding inflammatory changes. Distal colonic diverticulosis. Vascular/Lymphatic: No significant vascular findings are present. Small right lower quadrant lymph nodes are likely reactive. Reproductive: Uterus and bilateral adnexa are unremarkable. Other: Abdominal wall is unremarkable. Musculoskeletal: Lower lumbar spine degenerative changes. No acute osseous abnormality. IMPRESSION: Acute appendicitis.  No evidence of perforation or abscess. These results will be called to the ordering clinician or representative by the Radiologist Assistant, and communication documented in the PACS or [employer]. Electronically Signed   By: Guadlupe Spanish M.D.   On: 11/03/2019 15:00    Assessment and Plan: This is a 54 y.o. female with acute appendicitis.  --Patient will be admitted to surgical team.  She'll be NPO, with IV fluids, and IV antibiotics.  Discussed with her about laparoscopic appendectomy, particularly risks of bleeding, infection, injury to surrounding structures, and she's willing to proceed.  Discussed post-op course, possible discharge tomorrow.  COVID test is currently pending and will proceed once it's done and pending anesthesia availability.   Howie Ill, MD [facility] Pg:  [phone]

## 2019-11-03 NOTE — Anesthesia Procedure Notes (Signed)
Procedure Name: Intubation Date/Time: 11/03/2019 6:46 PM Performed by: Hezzie Bump, CRNA Pre-anesthesia Checklist: Patient identified, Patient being monitored, Timeout performed, Emergency Drugs available and Suction available Patient Re-evaluated:Patient Re-evaluated prior to induction Oxygen Delivery Method: Circle system utilized Preoxygenation: Pre-oxygenation with 100% oxygen Induction Type: Rapid sequence and IV induction Ventilation: Mask ventilation without difficulty Laryngoscope Size: 3 and McGraph Grade View: Grade I Tube type: Oral Tube size: 7.0 mm Number of attempts: 1 Airway Equipment and Method: Stylet Placement Confirmation: ETT inserted through vocal cords under direct vision,  positive ETCO2 and breath sounds checked- equal and bilateral Secured at: 21 cm Tube secured with: Tape Dental Injury: Teeth and Oropharynx as per pre-operative assessment

## 2019-11-03 NOTE — ED Triage Notes (Signed)
Called patient's ins co ([employer]) @ [phone] to pre-cert CT abd/pelvis with contrast (CPT [phone]). Spoke with Cala Bradford, auth# [id number] valid 11/03/19 to 04/30/20.

## 2019-11-03 NOTE — ED Notes (Signed)
Pt presents to ED with c/o of RLQ pain that started yesterday that was ore profuse through ABD but states this morning it became more localized to RLQ area. Pt states pain is tolerable at this time and rates it 6/10 and states the ride per EMS was bumpy which at first increased pain. Pt denies fevers or chills. Pt denies N/V/D.

## 2019-11-03 NOTE — ED Notes (Signed)
Pt resting comfortably at this time. No distress noted.

## 2019-11-03 NOTE — ED Triage Notes (Signed)
Patient is being discharged from the Urgent Care and sent to the Emergency Department via EMS . Per Dr. Adriana Simas, patient is in need of higher level of care due to acute appendicitis. Patient is aware and verbalizes understanding of plan of care.  Vitals:   11/03/19 1302  BP: 134/88  Pulse: 72  Resp: 18  Temp: 99.5 F (37.5 C)  SpO2: 98%

## 2019-11-03 NOTE — ED Provider Notes (Signed)
[facility]    CSN: [id number] Arrival date & time: 11/03/19  1243      History   Chief Complaint Chief Complaint  Patient presents with  . Abdominal Pain  . Chest Pain   HPI  54 year old female presents with the above complaints.  Patient reports that she developed abdominal pain yesterday.  Is currently located in the right lower quadrant.  Sharp.  Severe.  Currently 8/10 in severity.  Worse with movement and was worse on the drive over.  She has had a subjective fever and chills.  No documented true fever.  Denies nausea vomiting.  She does note anorexia.  Patient states that she is also had some diffuse chest tightness.  She believes that this may be from anxiety.  She is unsure.  No relieving factors.  No other associated symptoms.  No other complaints.  Past Medical History:  Diagnosis Date  . Bell palsy   . Endometriosis    Patient Active Problem List   Diagnosis Date Noted  . Uterus, adenomyosis 09/10/2019  . Complex tear of medial meniscus of left knee as current injury 11/16/2016  . Effusion, left knee 10/22/2016  . Endometriosis 01/06/2015   Home Medications    Prior to Admission medications   Not on File    Family History History reviewed. No pertinent family history.  Social History Social History   Tobacco Use  . Smoking status: Never Smoker  . Smokeless tobacco: Never Used  Substance Use Topics  . Alcohol use: Yes  . Drug use: No     Allergies   Patient has no known allergies.   Review of Systems Review of Systems  Constitutional: Positive for appetite change.  Respiratory: Positive for chest tightness.   Gastrointestinal: Positive for abdominal pain. Negative for nausea and vomiting.   Physical Exam Triage Vital Signs ED Triage Vitals  Enc Vitals Group     BP 11/03/19 1302 134/88     Pulse Rate 11/03/19 1302 72     Resp 11/03/19 1302 18     Temp 11/03/19 1302 99.5 F (37.5 C)     Temp Source 11/03/19 1302 Oral      SpO2 11/03/19 1302 98 %     Weight 11/03/19 1300 220 lb (99.8 kg)     Height 11/03/19 1300 5\' 8"  (1.727 m)     Head Circumference --      Peak Flow --      Pain Score 11/03/19 1300 8     Pain Loc --      Pain Edu? --      Excl. in GC? --    Updated Vital Signs BP 134/88 (BP Location: Right Arm)   Pulse 72   Temp 99.5 F (37.5 C) (Oral)   Resp 18   Ht 5\' 8"  (1.727 m)   Wt 99.8 kg   LMP 07/21/2014   SpO2 98%   BMI 33.45 kg/m   Visual Acuity Right Eye Distance:   Left Eye Distance:   Bilateral Distance:    Right Eye Near:   Left Eye Near:    Bilateral Near:     Physical Exam Vitals and nursing note reviewed.  Constitutional:      General: She is not in acute distress.    Appearance: Normal appearance.  HENT:     Head: Normocephalic and atraumatic.  Eyes:     General:        Right eye: No discharge.  Left eye: No discharge.     Conjunctiva/sclera: Conjunctivae normal.  Cardiovascular:     Rate and Rhythm: Normal rate and regular rhythm.  Pulmonary:     Effort: Pulmonary effort is normal.     Breath sounds: Normal breath sounds. No wheezing, rhonchi or rales.  Abdominal:     Comments: Soft, nondistended.  Exquisite tenderness to palpation in the right lower quadrant.  Neurological:     Mental Status: She is alert.  Psychiatric:        Mood and Affect: Mood normal.        Behavior: Behavior normal.    UC Treatments / Results  Labs (all labs ordered are listed, but only abnormal results are displayed) Labs Reviewed  URINALYSIS, COMPLETE (UACMP) WITH MICROSCOPIC - Abnormal; Notable for the following components:      Result Value   Hgb urine dipstick TRACE (*)    Protein, ur 30 (*)    Bacteria, UA FEW (*)    All other components within normal limits  CBC WITH DIFFERENTIAL/PLATELET - Abnormal; Notable for the following components:   WBC 11.9 (*)    Neutro Abs 9.3 (*)    All other components within normal limits  COMPREHENSIVE METABOLIC PANEL -  Abnormal; Notable for the following components:   Chloride 97 (*)    Glucose, Bld 124 (*)    Total Bilirubin 1.4 (*)    All other components within normal limits    EKG Interpretation: Normal sinus rhythm at the rate of 72.  Left axis deviation.  Incomplete right bundle branch block noted.  No ST or T wave changes.  Radiology CT ABDOMEN PELVIS W CONTRAST  Result Date: 11/03/2019 CLINICAL DATA:  Right lower quadrant pain EXAM: CT ABDOMEN AND PELVIS WITH CONTRAST TECHNIQUE: Multidetector CT imaging of the abdomen and pelvis was performed using the standard protocol following bolus administration of intravenous contrast. CONTRAST:  OMNIPAQUE IOHEXOL 300 MG/ML  SOLN COMPARISON:  None. FINDINGS: Lower chest: No acute abnormality. Hepatobiliary: Liver is unremarkable. Gallbladder is unremarkable. No biliary dilatation. Pancreas: Unremarkable. Spleen: Unremarkable. Adrenals/Urinary Tract: Adrenals, kidneys, and bladder are unremarkable. Stomach/Bowel: Stomach is within normal limits. The appendix is fluid-filled and mildly dilated. There are surrounding inflammatory changes. Distal colonic diverticulosis. Vascular/Lymphatic: No significant vascular findings are present. Small right lower quadrant lymph nodes are likely reactive. Reproductive: Uterus and bilateral adnexa are unremarkable. Other: Abdominal wall is unremarkable. Musculoskeletal: Lower lumbar spine degenerative changes. No acute osseous abnormality. IMPRESSION: Acute appendicitis.  No evidence of perforation or abscess. These results will be called to the ordering clinician or representative by the Radiologist Assistant, and communication documented in the PACS or [employer]. Electronically Signed   By: Guadlupe Spanish M.D.   On: 11/03/2019 15:00    Procedures Procedures (including critical care time)  Medications Ordered in UC Medications - No data to display  Initial Impression / Assessment and Plan / UC Course  I have  reviewed the triage vital signs and the nursing notes.  Pertinent labs & imaging results that were available during my care of the patient were reviewed by me and considered in my medical decision making (see chart for details).    54 year old female presents with abdominal pain.  Exquisite tenderness of the right lower quadrant on exam.  Labs notable for leukocytosis of 11.9.  Urinalysis not consistent with UTI.  CT abdomen pelvis was obtained after prior authorization.  It revealed findings consistent with acute appendicitis.  Patient is being transported to  the hospital via EMS.  I have spoken with [facility].  She needs surgical evaluation and admission.  Final Clinical Impressions(s) / UC Diagnoses   Final diagnoses:  Acute appendicitis, unspecified acute appendicitis type   Discharge Instructions   None    ED Prescriptions    None     PDMP not reviewed this encounter.   Tommie Sams, [place] 11/03/19 1514

## 2019-11-03 NOTE — ED Triage Notes (Signed)
Patient c/o RLQ sharp abdominal pain that started yesterday. She also reports chest tightness that started yesterday. She states she has been having diarrhea but has not had a BM since Sunday.

## 2019-11-03 NOTE — ED Notes (Signed)
Report to OR nurse. Last NPO status 0630. Pt given gown to undress in PACU due to pt being ina hallway bed. Pt sent with patient stickers.

## 2019-11-04 ENCOUNTER — Encounter: Payer: Self-pay | Admitting: Surgery

## 2019-11-04 LAB — BASIC METABOLIC PANEL
Anion gap: 11 (ref 5–15)
BUN: 11 mg/dL (ref 6–20)
CO2: 25 mmol/L (ref 22–32)
Calcium: 8.4 mg/dL — ABNORMAL LOW (ref 8.9–10.3)
Chloride: 102 mmol/L (ref 98–111)
Creatinine, Ser: 0.66 mg/dL (ref 0.44–1.00)
GFR, Estimated: >60 mL/min (ref >60)
Glucose, Bld: 152 mg/dL — ABNORMAL HIGH (ref 70–99)
Potassium: 3.8 mmol/L (ref 3.5–5.1)
Sodium: 138 mmol/L (ref 135–145)

## 2019-11-04 LAB — CBC
HCT: 34.4 % — ABNORMAL LOW (ref 36.0–46.0)
Hemoglobin: 11.5 g/dL — ABNORMAL LOW (ref 12.0–15.0)
MCH: 32 pg (ref 26.0–34.0)
MCHC: 33.4 g/dL (ref 30.0–36.0)
MCV: 95.8 fL (ref 80.0–100.0)
Platelets: 188 10*3/uL (ref 150–400)
RBC: 3.59 MIL/uL — ABNORMAL LOW (ref 3.87–5.11)
RDW: 14 % (ref 11.5–15.5)
WBC: 13.2 10*3/uL — ABNORMAL HIGH (ref 4.0–10.5)
nRBC: 0 % (ref 0.0–0.2)

## 2019-11-04 LAB — MAGNESIUM: Magnesium: 2.2 mg/dL (ref 1.7–2.4)

## 2019-11-04 LAB — HIV ANTIBODY (ROUTINE TESTING W REFLEX): HIV Screen 4th Generation wRfx: NONREACTIVE

## 2019-11-04 MED ORDER — IBUPROFEN 800 MG PO TABS: 800.0000 mg | ORAL_TABLET | Freq: Three times a day (TID) | ORAL | 0 refills | Status: DC | PRN

## 2019-11-04 MED ORDER — AMOXICILLIN-POT CLAVULANATE 875-125 MG PO TABS: 1.0000 | ORAL_TABLET | Freq: Two times a day (BID) | ORAL | 0 refills | Status: AC

## 2019-11-04 MED ORDER — OXYCODONE HCL 5 MG PO TABS: 5.0000 mg | ORAL_TABLET | Freq: Four times a day (QID) | ORAL | 0 refills | Status: DC | PRN

## 2019-11-04 NOTE — Discharge Instructions (Signed)
In addition to included general post-operative instructions for laparoscopic appendectomy (Appendix removal),  Diet: Resume home diet.   Activity: No heavy lifting >20 pounds (children, pets, laundry, garbage) for 4 weeks, but light activity and walking are encouraged. Do not drive or drink alcohol if taking narcotic pain medications or having pain that might distract from driving.  Wound care: 2 days after surgery (10/28), you may shower/get incision wet with soapy water and pat dry (do not rub incisions), but no baths or submerging incision underwater until follow-up.   Medications: Resume all home medications. For mild to moderate pain: acetaminophen (Tylenol) or ibuprofen/naproxen (if no kidney disease). Combining Tylenol with alcohol can substantially increase your risk of causing liver disease. Narcotic pain medications, if prescribed, can be used for severe pain, though may cause nausea, constipation, and drowsiness. Do not combine Tylenol and Percocet (or similar) within a 6 hour period as Percocet (and similar) contain(s) Tylenol. If you do not need the narcotic pain medication, you do not need to fill the prescription.  Call office [phone] / [phone]) at any time if any questions, worsening pain, fevers/chills, bleeding, drainage from incision site, or other concerns.

## 2019-11-04 NOTE — Progress Notes (Addendum)
Patient to tolerate lunch prior to discharge.   1317: Patient tolerated lunch without difficulty.

## 2019-11-04 NOTE — Discharge Summary (Addendum)
[facility] SURGICAL DISCHARGE SUMMARY  Patient ID: Kaitlyn Curtis MRN: [id number] DOB/AGE: September 28, 1965 54 y.o.  Admit date: 11/03/2019 Discharge date: 11/04/2019  Discharge Diagnoses Patient Active Problem List   Diagnosis Date Noted  . Acute appendicitis 11/03/2019  . Uterus, adenomyosis 09/10/2019  . Complex tear of medial meniscus of left knee as current injury 11/16/2016  . Effusion, left knee 10/22/2016  . Endometriosis 01/06/2015    Consultants None  Procedures 11/03/2019:  Laparoscopic Appendectomy    HPI: Kaitlyn Curtis is a 54 y.o. female presenting with a 2-day history of abdominal pain.  Patient reports that yesterday she started having mid abdominal pain that today started progressing and localizing towards the right lower quadrant.  Patient reports that the pain was quite severe earlier.  She denies having any nausea or vomiting but reports feeling feverish at home.  She presented to urgent care today and she had a CT scan done there which showed acute appendicitis and was sent to the emergency room for further evaluation.  Her laboratory work-up shows a white blood cell count of 11.9 with a normal creatinine of 0.78.  Her CT scan which is personally viewed by me shows acute appendicitis with no evidence of perforation or abscess.  The appendix is retrocecal.   Hospital Course: Informed consent was obtained and documented, and patient underwent uneventful laparoscopic appendectomy (Dr Leland Johns, 11/03/2019).  Post-operatively, patient's pain/symptoms improved/resolved and advancement of patient's diet and ambulation were well-tolerated. The remainder of patient's hospital course was essentially unremarkable, and discharge planning was initiated accordingly with patient safely able to be discharged home with appropriate discharge instructions, antibiotics (Augmentin x10 days), pain control, and outpatient follow-up after all of her questions were  answered to her expressed satisfaction.   Discharge Condition: Good   Physical Examination:  Constitutional: Well appearing female, NAD Pulmonary: Normal effort, no respiratory distress Gastrointestinal: Soft, incisional soreness, non-distended, no rebound/guarding Skin: Laparoscopic incisions are CDI, some ecchymosis, no erythema or drainage    Allergies as of 11/04/2019   No Known Allergies     Medication List    TAKE these medications   amoxicillin-clavulanate 875-125 MG tablet Commonly known as: Augmentin Take 1 tablet by mouth 2 (two) times daily for 10 days.   ibuprofen 800 MG tablet Commonly known as: ADVIL Take 1 tablet (800 mg total) by mouth every 8 (eight) hours as needed.   oxyCODONE 5 MG immediate release tablet Commonly known as: Oxy IR/ROXICODONE Take 1 tablet (5 mg total) by mouth every 6 (six) hours as needed for severe pain or breakthrough pain.         Follow-up Information    Donovan Kail, PA-C. Schedule an appointment as soon as possible for a visit in 2 week(s).   Specialty: Physician Assistant Why: s/p lap appy Contact information: [address] [place] [address] [phone]                Time spent on discharge management including discussion of hospital course, clinical condition, outpatient instructions, prescriptions, and follow up with the patient and members of the medical team: >30 minutes  -- Lynden Oxford , PA-C [facility]  11/04/2019, 10:03 AM [phone] M-F: 7am - 4pm

## 2019-11-05 LAB — SURGICAL PATHOLOGY

## 2019-11-16 NOTE — Anesthesia Postprocedure Evaluation (Signed)
Anesthesia Post Note  Patient: Kaitlyn Curtis  Procedure(s) Performed: APPENDECTOMY LAPAROSCOPIC (N/A Abdomen)  Patient location during evaluation: PACU Anesthesia Type: General Level of consciousness: awake and alert Pain management: pain level controlled Vital Signs Assessment: post-procedure vital signs reviewed and stable Respiratory status: spontaneous breathing, nonlabored ventilation, respiratory function stable and patient connected to nasal cannula oxygen Cardiovascular status: blood pressure returned to baseline and stable Postop Assessment: no apparent nausea or vomiting Anesthetic complications: no   No complications documented.   Last Vitals:  Vitals:   11/04/19 0507 11/04/19 0803  BP: 118/75 92/60  Pulse: 65 73  Resp: 18 18  Temp: 36.9 C 36.6 C  SpO2: 98% 96%    Last Pain:  Vitals:   11/04/19 0803  TempSrc: Oral  PainSc: 3                  Yevette Edwards

## 2019-11-20 ENCOUNTER — Encounter: Payer: Self-pay | Admitting: Physician Assistant

## 2019-11-20 ENCOUNTER — Other Ambulatory Visit: Payer: Self-pay

## 2019-11-20 ENCOUNTER — Ambulatory Visit (INDEPENDENT_AMBULATORY_CARE_PROVIDER_SITE_OTHER): Payer: BC Managed Care – PPO | Admitting: Physician Assistant

## 2019-11-20 VITALS — BP 122/81 | HR 57 | Temp 98.1°F | Resp 14 | Ht 68.5 in | Wt 224.0 lb

## 2019-11-20 DIAGNOSIS — K353 Acute appendicitis with localized peritonitis, without perforation or gangrene: Secondary | ICD-10-CM

## 2019-11-20 DIAGNOSIS — Z09 Encounter for follow-up examination after completed treatment for conditions other than malignant neoplasm: Secondary | ICD-10-CM

## 2019-11-20 NOTE — Progress Notes (Signed)
[facility] POST-OP OFFICE VISIT  11/20/2019  HPI: Kaitlyn Curtis is a 54 y.o. female 17 days s/p laparoscopic appendectomy for acute appendicitis with Dr Aleen Campi   She is doing well Minimal pain, soreness at most, not requiring pain medications No fever, chills, nausea, emesis Tolerating PO Normal bowel function No issues with incisions  Vital signs: BP 122/81   Pulse (!) 57   Temp 98.1 F (36.7 C) (Oral)   Resp 14   Ht 5' 8.5" (1.74 m)   Wt 224 lb (101.6 kg)   LMP 07/21/2014   SpO2 97%   BMI 33.56 kg/m    Physical Exam: Constitutional: Well appearing female, NAD Abdomen: Soft, non-tender, non-distended, no rebound/guiarding Skin: Laparoscopic incisions are well healed, no erythema or drainage   Assessment/Plan: This is a 54 y.o. female 17 days s/p laparoscopic appendectomy for acute appendicitis   - Pain control prn  - Reviewed wound care   -Reviewed lifting restrictions; complete 2 more weeks  - Reviewed pathology: Acute appendicitis, adhesions, negative for malignancy  - Rtc prn   -- Lynden Oxford, PA-C [facility] 11/20/2019, 10:39 AM [phone] M-F: 7am - 4pm

## 2019-11-20 NOTE — Patient Instructions (Signed)

## 2020-01-14 DIAGNOSIS — Z20822 Contact with and (suspected) exposure to covid-19: Secondary | ICD-10-CM | POA: Diagnosis not present

## 2020-01-14 DIAGNOSIS — R197 Diarrhea, unspecified: Secondary | ICD-10-CM | POA: Diagnosis not present

## 2020-01-15 DIAGNOSIS — R197 Diarrhea, unspecified: Secondary | ICD-10-CM | POA: Diagnosis not present

## 2020-02-23 DIAGNOSIS — S060X0A Concussion without loss of consciousness, initial encounter: Secondary | ICD-10-CM | POA: Diagnosis not present

## 2020-02-23 DIAGNOSIS — S0990XA Unspecified injury of head, initial encounter: Secondary | ICD-10-CM | POA: Diagnosis not present

## 2020-02-24 DIAGNOSIS — R519 Headache, unspecified: Secondary | ICD-10-CM | POA: Diagnosis not present

## 2020-02-24 DIAGNOSIS — S060X0A Concussion without loss of consciousness, initial encounter: Secondary | ICD-10-CM | POA: Diagnosis not present

## 2020-02-24 DIAGNOSIS — R5383 Other fatigue: Secondary | ICD-10-CM | POA: Diagnosis not present

## 2020-02-24 DIAGNOSIS — Z87891 Personal history of nicotine dependence: Secondary | ICD-10-CM | POA: Diagnosis not present

## 2020-02-25 DIAGNOSIS — F0781 Postconcussional syndrome: Secondary | ICD-10-CM | POA: Diagnosis not present

## 2020-03-10 DIAGNOSIS — F0781 Postconcussional syndrome: Secondary | ICD-10-CM | POA: Diagnosis not present

## 2020-03-10 DIAGNOSIS — N951 Menopausal and female climacteric states: Secondary | ICD-10-CM | POA: Diagnosis not present

## 2020-03-24 ENCOUNTER — Other Ambulatory Visit: Payer: Self-pay | Admitting: Neurology

## 2020-03-24 DIAGNOSIS — E538 Deficiency of other specified B group vitamins: Secondary | ICD-10-CM | POA: Diagnosis not present

## 2020-03-24 DIAGNOSIS — F0781 Postconcussional syndrome: Secondary | ICD-10-CM | POA: Diagnosis not present

## 2020-03-24 DIAGNOSIS — Z87820 Personal history of traumatic brain injury: Secondary | ICD-10-CM | POA: Diagnosis not present

## 2020-03-24 DIAGNOSIS — E559 Vitamin D deficiency, unspecified: Secondary | ICD-10-CM | POA: Diagnosis not present

## 2020-04-07 ENCOUNTER — Ambulatory Visit
Admission: RE | Admit: 2020-04-07 | Discharge: 2020-04-07 | Disposition: A | Discharge status: Home / Self Care | Payer: BC Managed Care – PPO | Source: Ambulatory Visit | Attending: Neurology | Admitting: Neurology

## 2020-04-07 ENCOUNTER — Other Ambulatory Visit: Payer: Self-pay

## 2020-04-07 DIAGNOSIS — R42 Dizziness and giddiness: Secondary | ICD-10-CM | POA: Diagnosis not present

## 2020-04-07 DIAGNOSIS — Z87820 Personal history of traumatic brain injury: Secondary | ICD-10-CM | POA: Diagnosis not present

## 2020-04-07 DIAGNOSIS — R519 Headache, unspecified: Secondary | ICD-10-CM | POA: Diagnosis not present

## 2020-04-07 MED ORDER — GADOBUTROL 1 MMOL/ML IV SOLN
10.0000 mL | Freq: Once | INTRAVENOUS | Status: AC | PRN
  Administered 2020-04-07: 10 mL via INTRAVENOUS

## 2020-04-26 DIAGNOSIS — H832X3 Labyrinthine dysfunction, bilateral: Secondary | ICD-10-CM | POA: Diagnosis not present

## 2020-04-26 DIAGNOSIS — F0781 Postconcussional syndrome: Secondary | ICD-10-CM | POA: Diagnosis not present

## 2020-04-26 DIAGNOSIS — R0683 Snoring: Secondary | ICD-10-CM | POA: Diagnosis not present

## 2020-04-26 DIAGNOSIS — H9313 Tinnitus, bilateral: Secondary | ICD-10-CM | POA: Diagnosis not present

## 2020-06-17 DIAGNOSIS — G4733 Obstructive sleep apnea (adult) (pediatric): Secondary | ICD-10-CM | POA: Diagnosis not present

## 2020-06-21 DIAGNOSIS — H9313 Tinnitus, bilateral: Secondary | ICD-10-CM | POA: Diagnosis not present

## 2020-06-21 DIAGNOSIS — G4733 Obstructive sleep apnea (adult) (pediatric): Secondary | ICD-10-CM | POA: Diagnosis not present

## 2020-06-21 DIAGNOSIS — F0781 Postconcussional syndrome: Secondary | ICD-10-CM | POA: Diagnosis not present

## 2020-06-21 DIAGNOSIS — H832X3 Labyrinthine dysfunction, bilateral: Secondary | ICD-10-CM | POA: Diagnosis not present

## 2020-07-25 DIAGNOSIS — R293 Abnormal posture: Secondary | ICD-10-CM | POA: Diagnosis not present

## 2020-07-25 DIAGNOSIS — R262 Difficulty in walking, not elsewhere classified: Secondary | ICD-10-CM | POA: Diagnosis not present

## 2020-07-25 DIAGNOSIS — F0781 Postconcussional syndrome: Secondary | ICD-10-CM | POA: Diagnosis not present

## 2020-07-25 DIAGNOSIS — G4486 Cervicogenic headache: Secondary | ICD-10-CM | POA: Diagnosis not present

## 2020-07-27 DIAGNOSIS — R262 Difficulty in walking, not elsewhere classified: Secondary | ICD-10-CM | POA: Diagnosis not present

## 2020-07-27 DIAGNOSIS — R293 Abnormal posture: Secondary | ICD-10-CM | POA: Diagnosis not present

## 2020-07-27 DIAGNOSIS — G4486 Cervicogenic headache: Secondary | ICD-10-CM | POA: Diagnosis not present

## 2020-07-27 DIAGNOSIS — F0781 Postconcussional syndrome: Secondary | ICD-10-CM | POA: Diagnosis not present

## 2020-08-02 DIAGNOSIS — G4486 Cervicogenic headache: Secondary | ICD-10-CM | POA: Diagnosis not present

## 2020-08-02 DIAGNOSIS — F0781 Postconcussional syndrome: Secondary | ICD-10-CM | POA: Diagnosis not present

## 2020-08-02 DIAGNOSIS — R262 Difficulty in walking, not elsewhere classified: Secondary | ICD-10-CM | POA: Diagnosis not present

## 2020-08-02 DIAGNOSIS — R293 Abnormal posture: Secondary | ICD-10-CM | POA: Diagnosis not present

## 2020-08-04 DIAGNOSIS — F0781 Postconcussional syndrome: Secondary | ICD-10-CM | POA: Diagnosis not present

## 2020-08-04 DIAGNOSIS — R293 Abnormal posture: Secondary | ICD-10-CM | POA: Diagnosis not present

## 2020-08-04 DIAGNOSIS — G4486 Cervicogenic headache: Secondary | ICD-10-CM | POA: Diagnosis not present

## 2020-08-04 DIAGNOSIS — R262 Difficulty in walking, not elsewhere classified: Secondary | ICD-10-CM | POA: Diagnosis not present

## 2020-08-10 DIAGNOSIS — R293 Abnormal posture: Secondary | ICD-10-CM | POA: Diagnosis not present

## 2020-08-10 DIAGNOSIS — G4486 Cervicogenic headache: Secondary | ICD-10-CM | POA: Diagnosis not present

## 2020-08-10 DIAGNOSIS — R262 Difficulty in walking, not elsewhere classified: Secondary | ICD-10-CM | POA: Diagnosis not present

## 2020-08-10 DIAGNOSIS — F0781 Postconcussional syndrome: Secondary | ICD-10-CM | POA: Diagnosis not present

## 2020-08-12 DIAGNOSIS — F0781 Postconcussional syndrome: Secondary | ICD-10-CM | POA: Diagnosis not present

## 2020-08-12 DIAGNOSIS — R293 Abnormal posture: Secondary | ICD-10-CM | POA: Diagnosis not present

## 2020-08-12 DIAGNOSIS — R262 Difficulty in walking, not elsewhere classified: Secondary | ICD-10-CM | POA: Diagnosis not present

## 2020-08-12 DIAGNOSIS — G4486 Cervicogenic headache: Secondary | ICD-10-CM | POA: Diagnosis not present

## 2020-08-16 DIAGNOSIS — G4486 Cervicogenic headache: Secondary | ICD-10-CM | POA: Diagnosis not present

## 2020-08-16 DIAGNOSIS — R262 Difficulty in walking, not elsewhere classified: Secondary | ICD-10-CM | POA: Diagnosis not present

## 2020-08-16 DIAGNOSIS — R293 Abnormal posture: Secondary | ICD-10-CM | POA: Diagnosis not present

## 2020-08-18 DIAGNOSIS — R262 Difficulty in walking, not elsewhere classified: Secondary | ICD-10-CM | POA: Diagnosis not present

## 2020-08-18 DIAGNOSIS — R293 Abnormal posture: Secondary | ICD-10-CM | POA: Diagnosis not present

## 2020-08-18 DIAGNOSIS — G4486 Cervicogenic headache: Secondary | ICD-10-CM | POA: Diagnosis not present

## 2020-10-10 DIAGNOSIS — F0781 Postconcussional syndrome: Secondary | ICD-10-CM | POA: Diagnosis not present

## 2020-10-10 DIAGNOSIS — E569 Vitamin deficiency, unspecified: Secondary | ICD-10-CM | POA: Diagnosis not present

## 2020-10-26 DIAGNOSIS — Z1231 Encounter for screening mammogram for malignant neoplasm of breast: Secondary | ICD-10-CM | POA: Diagnosis not present

## 2020-11-14 DIAGNOSIS — D1801 Hemangioma of skin and subcutaneous tissue: Secondary | ICD-10-CM | POA: Diagnosis not present

## 2020-11-14 DIAGNOSIS — L249 Irritant contact dermatitis, unspecified cause: Secondary | ICD-10-CM | POA: Diagnosis not present

## 2020-11-14 DIAGNOSIS — L918 Other hypertrophic disorders of the skin: Secondary | ICD-10-CM | POA: Diagnosis not present

## 2021-01-18 ENCOUNTER — Telehealth: Payer: BC Managed Care – PPO | Admitting: Physician Assistant

## 2021-01-18 DIAGNOSIS — J069 Acute upper respiratory infection, unspecified: Secondary | ICD-10-CM | POA: Diagnosis not present

## 2021-01-18 MED ORDER — BENZONATATE 100 MG PO CAPS: 100.0000 mg | ORAL_CAPSULE | Freq: Three times a day (TID) | ORAL | 0 refills | Status: AC | PRN

## 2021-01-18 MED ORDER — FLUTICASONE PROPIONATE 50 MCG/ACT NA SUSP: 2.0000 | Freq: Every day | NASAL | 0 refills | Status: AC

## 2021-01-18 NOTE — Progress Notes (Signed)

## 2021-01-18 NOTE — Progress Notes (Signed)
I have spent 5 minutes in review of e-visit questionnaire, review and updating patient chart, medical decision making and response to patient.   Kaitlyn Curtis Cody Aayansh Codispoti, PA-C    

## 2021-01-20 ENCOUNTER — Telehealth: Payer: BC Managed Care – PPO | Admitting: Physician Assistant

## 2021-01-20 DIAGNOSIS — J019 Acute sinusitis, unspecified: Secondary | ICD-10-CM | POA: Diagnosis not present

## 2021-01-20 DIAGNOSIS — B9689 Other specified bacterial agents as the cause of diseases classified elsewhere: Secondary | ICD-10-CM | POA: Diagnosis not present

## 2021-01-20 MED ORDER — DOXYCYCLINE HYCLATE 100 MG PO TABS: 100.0000 mg | ORAL_TABLET | Freq: Two times a day (BID) | ORAL | 0 refills | Status: AC

## 2021-01-20 NOTE — Progress Notes (Signed)
Virtual Visit Consent   Kaitlyn Curtis, you are scheduled for a virtual visit with a [facility] provider today.     Just as with appointments in the office, your consent must be obtained to participate.  Your consent will be active for this visit and any virtual visit you may have with one of our providers in the next 365 days.     If you have a MyChart account, a copy of this consent can be sent to you electronically.  All virtual visits are billed to your insurance company just like a traditional visit in the office.    As this is a virtual visit, video technology does not allow for your provider to perform a traditional examination.  This may limit your provider's ability to fully assess your condition.  If your provider identifies any concerns that need to be evaluated in person or the need to arrange testing (such as labs, EKG, etc.), we will make arrangements to do so.     Although advances in technology are sophisticated, we cannot ensure that it will always work on either your end or our end.  If the connection with a video visit is poor, the visit may have to be switched to a telephone visit.  With either a video or telephone visit, we are not always able to ensure that we have a secure connection.     I need to obtain your verbal consent now.   Are you willing to proceed with your visit today?    Kaitlyn Curtis has provided verbal consent on 01/20/2021 for a virtual visit (video or telephone).   [name], PA-C   Date: 01/20/2021 1:19 PM   Virtual Visit via Video Note   I, [name], connected with  Kaitlyn Curtis  ([id number], 1965/05/31) on 01/20/21 at  1:15 PM EST by a video-enabled telemedicine application and verified that I am speaking with the correct person using two identifiers.  Location: Patient: Virtual Visit Location Patient: Home Provider: Virtual Visit Location Provider: Home Office   I discussed the limitations of evaluation and  management by telemedicine and the availability of in person appointments. The patient expressed understanding and agreed to proceed.    History of Present Illness: Kaitlyn Curtis is a 57 y.o. who identifies as a female who was assigned female at birth, and is being seen today for possible sinus infection.  HPI: Sinusitis This is a new problem. The current episode started in the past 7 days. The problem has been gradually worsening since onset. There has been no fever. She is experiencing no pain. Associated symptoms include chills, congestion, coughing, headaches and sinus pressure. Pertinent negatives include no hoarse voice or sore throat. Treatments tried: tessalon perles and fluticasone. The treatment provided no relief.     Problems:  Patient Active Problem List   Diagnosis Date Noted   Acute appendicitis 11/03/2019   Uterus, adenomyosis 09/10/2019   Complex tear of medial meniscus of left knee as current injury 11/16/2016   Effusion, left knee 10/22/2016   Endometriosis 01/06/2015    Allergies: No Known Allergies Medications:  Current Outpatient Medications:    doxycycline (VIBRA-TABS) 100 MG tablet, Take 1 tablet (100 mg total) by mouth 2 (two) times daily., Disp: 20 tablet, Rfl: 0   benzonatate (TESSALON) 100 MG capsule, Take 1 capsule (100 mg total) by mouth 3 (three) times daily as needed for cough., Disp: 30 capsule, Rfl: 0   fluticasone (FLONASE) 50 MCG/ACT  nasal spray, Place 2 sprays into both nostrils daily., Disp: 16 g, Rfl: 0  Observations/Objective: Patient is well-developed, well-nourished in no acute distress.  Resting comfortably at home.  Head is normocephalic, atraumatic.  No labored breathing.  Speech is clear and coherent with logical content.  Patient is alert and oriented at baseline.    Assessment and Plan: 1. Acute bacterial sinusitis - doxycycline (VIBRA-TABS) 100 MG tablet; Take 1 tablet (100 mg total) by mouth 2 (two) times daily.  Dispense: 20  tablet; Refill: 0  - Worsening symptoms that have not responded to OTC medications.  - Will give Doxycycline -Continue flonase and tessalon perles as needed - Continue allergy medications - Steam treatments and humidifier can help - Stay well hydrated and get plenty of rest.  - Call if no symptom improvement or if symptoms worsen.  Follow Up Instructions: I discussed the assessment and treatment plan with the patient. The patient was provided an opportunity to ask questions and all were answered. The patient agreed with the plan and demonstrated an understanding of the instructions.  A copy of instructions were sent to the patient via MyChart unless otherwise noted below.   The patient was advised to call back or seek an in-person evaluation if the symptoms worsen or if the condition fails to improve as anticipated.  Time:  I spent 8 minutes with the patient via telehealth technology discussing the above problems/concerns.    [name], PA-C

## 2021-01-20 NOTE — Patient Instructions (Signed)
Kaitlyn Curtis, thank you for joining [name], PA-C for today's virtual visit.  While this provider is not your primary care provider (PCP), if your PCP is located in our provider database this encounter information will be shared with them immediately following your visit.  Consent: (Patient) Kaitlyn Curtis provided verbal consent for this virtual visit at the beginning of the encounter.  Current Medications:  Current Outpatient Medications:    doxycycline (VIBRA-TABS) 100 MG tablet, Take 1 tablet (100 mg total) by mouth 2 (two) times daily., Disp: 20 tablet, Rfl: 0   benzonatate (TESSALON) 100 MG capsule, Take 1 capsule (100 mg total) by mouth 3 (three) times daily as needed for cough., Disp: 30 capsule, Rfl: 0   fluticasone (FLONASE) 50 MCG/ACT nasal spray, Place 2 sprays into both nostrils daily., Disp: 16 g, Rfl: 0   Medications ordered in this encounter:  Meds ordered this encounter  Medications   doxycycline (VIBRA-TABS) 100 MG tablet    Sig: Take 1 tablet (100 mg total) by mouth 2 (two) times daily.    Dispense:  20 tablet    Refill:  0    Order Specific Question:   Supervising Provider    Answer:   Hyacinth Meeker, BRIAN [[id number]]     *If you need refills on other medications prior to your next appointment, please contact your pharmacy*  Follow-Up: Call back or seek an in-person evaluation if the symptoms worsen or if the condition fails to improve as anticipated.  Other Instructions Sinusitis, Adult Sinusitis is inflammation of your sinuses. Sinuses are hollow spaces in the bones around your face. Your sinuses are located: Around your eyes. In the middle of your forehead. Behind your nose. In your cheekbones. Mucus normally drains out of your sinuses. When your nasal tissues become inflamed or swollen, mucus can become trapped or blocked. This allows bacteria, viruses, and fungi to grow, which leads to infection. Most infections of the sinuses are caused by a  virus. Sinusitis can develop quickly. It can last for up to 4 weeks (acute) or for more than 12 weeks (chronic). Sinusitis often develops after a cold. What are the causes? This condition is caused by anything that creates swelling in the sinuses or stops mucus from draining. This includes: Allergies. Asthma. Infection from bacteria or viruses. Deformities or blockages in your nose or sinuses. Abnormal growths in the nose (nasal polyps). Pollutants, such as chemicals or irritants in the air. Infection from fungi (rare). What increases the risk? You are more likely to develop this condition if you: Have a weak body defense system (immune system). Do a lot of swimming or diving. Overuse nasal sprays. Smoke. What are the signs or symptoms? The main symptoms of this condition are pain and a feeling of pressure around the affected sinuses. Other symptoms include: Stuffy nose or congestion. Thick drainage from your nose. Swelling and warmth over the affected sinuses. Headache. Upper toothache. A cough that may get worse at night. Extra mucus that collects in the throat or the back of the nose (postnasal drip). Decreased sense of smell and taste. Fatigue. A fever. Sore throat. Bad breath. How is this diagnosed? This condition is diagnosed based on: Your symptoms. Your medical history. A physical exam. Tests to find out if your condition is acute or chronic. This may include: Checking your nose for nasal polyps. Viewing your sinuses using a device that has a light (endoscope). Testing for allergies or bacteria. Imaging tests, such as an MRI or  CT scan. In rare cases, a bone biopsy may be done to rule out more serious types of fungal sinus disease. How is this treated? Treatment for sinusitis depends on the cause and whether your condition is chronic or acute. If caused by a virus, your symptoms should go away on their own within 10 days. You may be given medicines to relieve  symptoms. They include: Medicines that shrink swollen nasal passages (topical intranasal decongestants). Medicines that treat allergies (antihistamines). A spray that eases inflammation of the nostrils (topical intranasal corticosteroids). Rinses that help get rid of thick mucus in your nose (nasal saline washes). If caused by bacteria, your health care provider may recommend waiting to see if your symptoms improve. Most bacterial infections will get better without antibiotic medicine. You may be given antibiotics if you have: A severe infection. A weak immune system. If caused by narrow nasal passages or nasal polyps, you may need to have surgery. Follow these instructions at home: Medicines Take, use, or apply over-the-counter and prescription medicines only as told by your health care provider. These may include nasal sprays. If you were prescribed an antibiotic medicine, take it as told by your health care provider. Do not stop taking the antibiotic even if you start to feel better. Hydrate and humidify  Drink enough fluid to keep your urine pale yellow. Staying hydrated will help to thin your mucus. Use a cool mist humidifier to keep the humidity level in your home above 50%. Inhale steam for 10-15 minutes, 3-4 times a day, or as told by your health care provider. You can do this in the bathroom while a hot shower is running. Limit your exposure to cool or dry air. Rest Rest as much as possible. Sleep with your head raised (elevated). Make sure you get enough sleep each night. General instructions  Apply a warm, moist washcloth to your face 3-4 times a day or as told by your health care provider. This will help with discomfort. Wash your hands often with soap and water to reduce your exposure to germs. If soap and water are not available, use hand sanitizer. Do not smoke. Avoid being around people who are smoking (secondhand smoke). Keep all follow-up visits as told by your health  care provider. This is important. Contact a health care provider if: You have a fever. Your symptoms get worse. Your symptoms do not improve within 10 days. Get help right away if: You have a severe headache. You have persistent vomiting. You have severe pain or swelling around your face or eyes. You have vision problems. You develop confusion. Your neck is stiff. You have trouble breathing. Summary Sinusitis is soreness and inflammation of your sinuses. Sinuses are hollow spaces in the bones around your face. This condition is caused by nasal tissues that become inflamed or swollen. The swelling traps or blocks the flow of mucus. This allows bacteria, viruses, and fungi to grow, which leads to infection. If you were prescribed an antibiotic medicine, take it as told by your health care provider. Do not stop taking the antibiotic even if you start to feel better. Keep all follow-up visits as told by your health care provider. This is important. This information is not intended to replace advice given to you by your health care provider. Make sure you discuss any questions you have with your health care provider. Document Revised: 05/27/2017 Document Reviewed: 05/27/2017 Elsevier Patient Education  2022 [employer].    If you have been instructed to have  an in-person evaluation today at a local Urgent Care facility, please use the link below. It will take you to a list of all of our available [facility], including address, phone number and hours of operation. Please do not delay care.  [facility]  If you or a family member do not have a primary care provider, use the link below to schedule a visit and establish care. When you choose a [facility] primary care physician or advanced practice provider, you gain a long-term partner in health. Find a Primary Care Provider  Learn more about [facility] in-office and virtual care options: [facility] - Get Care  Now

## 2021-04-05 ENCOUNTER — Telehealth: Payer: BLUE CROSS/BLUE SHIELD | Admitting: Physician Assistant

## 2021-04-05 DIAGNOSIS — H109 Unspecified conjunctivitis: Secondary | ICD-10-CM | POA: Diagnosis not present

## 2021-04-05 DIAGNOSIS — B9689 Other specified bacterial agents as the cause of diseases classified elsewhere: Secondary | ICD-10-CM

## 2021-04-05 MED ORDER — POLYMYXIN B-TRIMETHOPRIM 10000-0.1 UNIT/ML-% OP SOLN: 1.0000 [drp] | OPHTHALMIC | 0 refills | Status: AC

## 2021-04-05 NOTE — Progress Notes (Signed)
?Virtual Visit Consent  ? ?Kaitlyn Curtis, you are scheduled for a virtual visit with a [facility] provider today.   ?  ?Just as with appointments in the office, your consent must be obtained to participate.  Your consent will be active for this visit and any virtual visit you may have with one of our providers in the next 365 days.   ?  ?If you have a MyChart account, a copy of this consent can be sent to you electronically.  All virtual visits are billed to your insurance company just like a traditional visit in the office.   ? ?As this is a virtual visit, video technology does not allow for your provider to perform a traditional examination.  This may limit your provider's ability to fully assess your condition.  If your provider identifies any concerns that need to be evaluated in person or the need to arrange testing (such as labs, EKG, etc.), we will make arrangements to do so.   ?  ?Although advances in technology are sophisticated, we cannot ensure that it will always work on either your end or our end.  If the connection with a video visit is poor, the visit may have to be switched to a telephone visit.  With either a video or telephone visit, we are not always able to ensure that we have a secure connection.    ? ?I need to obtain your verbal consent now.   Are you willing to proceed with your visit today?  ?  ?Kaitlyn Curtis has provided verbal consent on 04/05/2021 for a virtual visit (video or telephone). ?  ?[name], PA-C  ? ?Date: 04/05/2021 7:08 PM ? ? ?Virtual Visit via Video Note  ? ?Kaitlyn Curtis, connected with  Kaitlyn Curtis  ([id number], 04-Mar-1965) on 04/05/21 at  7:00 PM EDT by a video-enabled telemedicine application and verified that I am speaking with the correct person using two identifiers. ? ?Location: ?Patient: Virtual Visit Location Patient: Home ?Provider: Virtual Visit Location Provider: Home Office ?  ?I discussed the limitations of evaluation and  management by telemedicine and the availability of in person appointments. The patient expressed understanding and agreed to proceed.   ? ?History of Present Illness: ?Kaitlyn Curtis is a 56 y.o. who identifies as a female who was assigned female at birth, and is being seen today for possible conjunctivitis. ? ?HPI: Conjunctivitis  ?The current episode started 3 to 5 days ago (Friday noticed congestion, Sunday eyes started with symptoms, but worsened today). The onset was sudden. The problem occurs continuously. The problem has been gradually worsening. The problem is mild. Nothing relieves the symptoms. Nothing aggravates the symptoms. Associated symptoms include decreased vision (blurred vision and floaters), eye itching (sand paper), congestion, eye discharge (purulent) and eye redness. Pertinent negatives include no fever, no double vision, no photophobia and no eye pain. The eye pain is mild. Both eyes are affected. The eye pain is not associated with movement. The eyelid exhibits swelling.   ? ? ?Problems:  ?Patient Active Problem List  ? Diagnosis Date Noted  ? Acute appendicitis 11/03/2019  ? Uterus, adenomyosis 09/10/2019  ? Complex tear of medial meniscus of left knee as current injury 11/16/2016  ? Effusion, left knee 10/22/2016  ? Endometriosis 01/06/2015  ?  ?Allergies: No Known Allergies ?Medications:  ?Current Outpatient Medications:  ?  trimethoprim-polymyxin b (POLYTRIM) ophthalmic solution, Place 1 drop into both eyes every 4 (four) hours. X 5  days, Disp: 10 mL, Rfl: 0 ?  benzonatate (TESSALON) 100 MG capsule, Take 1 capsule (100 mg total) by mouth 3 (three) times daily as needed for cough., Disp: 30 capsule, Rfl: 0 ?  doxycycline (VIBRA-TABS) 100 MG tablet, Take 1 tablet (100 mg total) by mouth 2 (two) times daily., Disp: 20 tablet, Rfl: 0 ?  fluticasone (FLONASE) 50 MCG/ACT nasal spray, Place 2 sprays into both nostrils daily., Disp: 16 g, Rfl: 0 ? ?Observations/Objective: ?Patient is  well-developed, well-nourished in no acute distress.  ?Resting comfortably at home.  ?Head is normocephalic, atraumatic.  ?No labored breathing.  ?Speech is clear and coherent with logical content.  ?Patient is alert and oriented at baseline.  ?Bilateral eyes are injected, mild swelling noted around the eyelids and copious clear drainage noted bilaterally ? ?Assessment and Plan: ?1. Bacterial conjunctivitis of both eyes ?- trimethoprim-polymyxin b (POLYTRIM) ophthalmic solution; Place 1 drop into both eyes every 4 (four) hours. X 5 days  Dispense: 10 mL; Refill: 0 ? ?- Suspect bacterial conjunctivitis ?- Polytrim prescribed ?- Warm compresses ?- Good hand hygiene ?- Seek in person evaluation if symptoms fail to improve or worsen ? ?Follow Up Instructions: ?I discussed the assessment and treatment plan with the patient. The patient was provided an opportunity to ask questions and all were answered. The patient agreed with the plan and demonstrated an understanding of the instructions.  A copy of instructions were sent to the patient via MyChart unless otherwise noted below.  ? ? ?The patient was advised to call back or seek an in-person evaluation if the symptoms worsen or if the condition fails to improve as anticipated. ? ?Time:  ?I spent 10 minutes with the patient via telehealth technology discussing the above problems/concerns.   ? ?[name], PA-C ?

## 2021-04-05 NOTE — Patient Instructions (Signed)
?Kaitlyn Curtis, thank you for joining Mar Daring, PA-C for today's virtual visit.  While this provider is not your primary care provider (PCP), if your PCP is located in our provider database this encounter information will be shared with them immediately following your visit. ? ?Consent: ?(Patient) Kaitlyn Curtis provided verbal consent for this virtual visit at the beginning of the encounter. ? ?Current Medications: ? ?Current Outpatient Medications:  ?  trimethoprim-polymyxin b (POLYTRIM) ophthalmic solution, Place 1 drop into both eyes every 4 (four) hours. X 5 days, Disp: 10 mL, Rfl: 0 ?  benzonatate (TESSALON) 100 MG capsule, Take 1 capsule (100 mg total) by mouth 3 (three) times daily as needed for cough., Disp: 30 capsule, Rfl: 0 ?  doxycycline (VIBRA-TABS) 100 MG tablet, Take 1 tablet (100 mg total) by mouth 2 (two) times daily., Disp: 20 tablet, Rfl: 0 ?  fluticasone (FLONASE) 50 MCG/ACT nasal spray, Place 2 sprays into both nostrils daily., Disp: 16 g, Rfl: 0  ? ?Medications ordered in this encounter:  ?Meds ordered this encounter  ?Medications  ? trimethoprim-polymyxin b (POLYTRIM) ophthalmic solution  ?  Sig: Place 1 drop into both eyes every 4 (four) hours. X 5 days  ?  Dispense:  10 mL  ?  Refill:  0  ?  Order Specific Question:   Supervising Provider  ?  Answer:   Noemi Chapel [[id number]]  ?  ? ?*If you need refills on other medications prior to your next appointment, please contact your pharmacy* ? ?Follow-Up: ?Call back or seek an in-person evaluation if the symptoms worsen or if the condition fails to improve as anticipated. ? ?Other Instructions ? ?Bacterial Conjunctivitis, Adult ?Bacterial conjunctivitis is an infection of the clear membrane that covers the white part of the eye and the inner surface of the eyelid (conjunctiva). When the blood vessels in the conjunctiva become inflamed, the eye becomes red or pink. The eye often feels irritated or itchy. Bacterial conjunctivitis  spreads easily from person to person (is contagious). It also spreads easily from one eye to the other eye. ?What are the causes? ?This condition is caused by bacteria. You may get the infection if you come into close contact with: ?A person who is infected with the bacteria. ?Items that are contaminated with the bacteria, such as a face towel, contact lens solution, or eye makeup. ?What increases the risk? ?You are more likely to develop this condition if: ?You are exposed to other people who have the infection. ?You wear contact lenses. ?You have a sinus infection. ?You have had a recent eye injury or surgery. ?You have a weak body defense system (immune system). ?You have a medical condition that causes dry eyes. ?What are the signs or symptoms? ?Symptoms of this condition include: ?Thick, yellowish discharge from the eye. This may turn into a crust on the eyelid overnight and cause your eyelids to stick together. ?Tearing or watery eyes. ?Itchy eyes. ?Burning feeling in your eyes. ?Eye redness. ?Swollen eyelids. ?Blurred vision. ?How is this diagnosed? ?This condition is diagnosed based on your symptoms and medical history. Your health care provider may also take a sample of discharge from your eye to find the cause of your infection. ?How is this treated? ?This condition may be treated with: ?Antibiotic eye drops or ointment to clear the infection more quickly and prevent the spread of infection to others. ?Antibiotic medicines taken by mouth (orally) to treat infections that do not respond to drops or ointments or  that last longer than 10 days. ?Cool, wet cloths (cool compresses) placed on the eyes. ?Artificial tears applied 2-6 times a day. ?Follow these instructions at home: ?Medicines ?Take or apply your antibiotic medicine as told by your health care provider. Do not stop using the antibiotic, even if your condition improves, unless directed by your health care provider. ?Take or apply over-the-counter and  prescription medicines only as told by your health care provider. ?Be very careful to avoid touching the edge of your eyelid with the eye-drop bottle or the ointment tube when you apply medicines to the affected eye. This will keep you from spreading the infection to your other eye or to other people. ?Managing discomfort ?Gently wipe away any drainage from your eye with a warm, wet washcloth or a cotton ball. ?Apply a clean, cool compress to your eye for 10-20 minutes, 3-4 times a day. ?General instructions ?Do not wear contact lenses until the inflammation is gone and your health care provider says it is safe to wear them again. Ask your health care provider how to sterilize or replace your contact lenses before you use them again. Wear glasses until you can resume wearing contact lenses. ?Avoid wearing eye makeup until the inflammation is gone. Throw away any old eye cosmetics that may be contaminated. ?Change or wash your pillowcase every day. ?Do not share towels or washcloths. This may spread the infection. ?Wash your hands often with soap and water for at least 20 seconds and especially before touching your face or eyes. Use paper towels to dry your hands. ?Avoid touching or rubbing your eyes. ?Do not drive or use heavy machinery if your vision is blurred. ?Contact a health care provider if: ?You have a fever. ?Your symptoms do not get better after 10 days. ?Get help right away if: ?You have a fever and your symptoms suddenly get worse. ?You have severe pain when you move your eye. ?You have facial pain, redness, or swelling. ?You have a sudden loss of vision. ?Summary ?Bacterial conjunctivitis is an infection of the clear membrane that covers the white part of the eye and the inner surface of the eyelid (conjunctiva). ?Bacterial conjunctivitis spreads easily from eye to eye and from person to person (is contagious). ?Wash your hands often with soap and water for at least 20 seconds and especially before  touching your face or eyes. Use paper towels to dry your hands. ?Take or apply your antibiotic medicine as told by your health care provider. Do not stop using the antibiotic even if your condition improves. ?Contact a health care provider if you have a fever or if your symptoms do not get better after 10 days. Get help right away if you have a sudden loss of vision. ?This information is not intended to replace advice given to you by your health care provider. Make sure you discuss any questions you have with your health care provider. ?Document Revised: 04/06/2020 Document Reviewed: 04/06/2020 ?Elsevier Patient Education ? 2022 [employer] ? ? ? ?If you have been instructed to have an in-person evaluation today at a local Urgent Care facility, please use the link below. It will take you to a list of all of our available [facility], including address, phone number and hours of operation. Please do not delay care.  ?[facility] ? ?If you or a family member do not have a primary care provider, use the link below to schedule a visit and establish care. When you choose  a [facility] primary care physician or advanced practice provider, you gain a long-term partner in health. ?Find a Primary Care Provider ? ?Learn more about [facility]'s in-office and virtual care options: ?[facility] ?

## 2021-04-12 DIAGNOSIS — J029 Acute pharyngitis, unspecified: Secondary | ICD-10-CM | POA: Diagnosis not present

## 2021-05-01 DIAGNOSIS — Z87820 Personal history of traumatic brain injury: Secondary | ICD-10-CM | POA: Diagnosis not present

## 2021-05-01 DIAGNOSIS — H832X3 Labyrinthine dysfunction, bilateral: Secondary | ICD-10-CM | POA: Diagnosis not present

## 2021-05-01 DIAGNOSIS — F0781 Postconcussional syndrome: Secondary | ICD-10-CM | POA: Diagnosis not present

## 2021-05-01 DIAGNOSIS — R519 Headache, unspecified: Secondary | ICD-10-CM | POA: Diagnosis not present

## 2021-09-05 DIAGNOSIS — F0781 Postconcussional syndrome: Secondary | ICD-10-CM | POA: Diagnosis not present

## 2021-09-05 DIAGNOSIS — H9313 Tinnitus, bilateral: Secondary | ICD-10-CM | POA: Diagnosis not present

## 2021-09-05 DIAGNOSIS — G4733 Obstructive sleep apnea (adult) (pediatric): Secondary | ICD-10-CM | POA: Diagnosis not present

## 2021-09-05 DIAGNOSIS — R519 Headache, unspecified: Secondary | ICD-10-CM | POA: Diagnosis not present

## 2021-10-24 DIAGNOSIS — Z1231 Encounter for screening mammogram for malignant neoplasm of breast: Secondary | ICD-10-CM | POA: Diagnosis not present

## 2022-01-12 DIAGNOSIS — Z1331 Encounter for screening for depression: Secondary | ICD-10-CM | POA: Diagnosis not present

## 2022-01-12 DIAGNOSIS — Z1330 Encounter for screening examination for mental health and behavioral disorders, unspecified: Secondary | ICD-10-CM | POA: Diagnosis not present

## 2022-01-12 DIAGNOSIS — E669 Obesity, unspecified: Secondary | ICD-10-CM | POA: Diagnosis not present

## 2022-01-12 DIAGNOSIS — Z114 Encounter for screening for human immunodeficiency virus [HIV]: Secondary | ICD-10-CM | POA: Diagnosis not present

## 2022-01-12 DIAGNOSIS — Z1159 Encounter for screening for other viral diseases: Secondary | ICD-10-CM | POA: Diagnosis not present
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
